# Patient Record
Sex: Female | Born: 1990 | Race: White | Hispanic: No | Marital: Married | Smoking: Heavy tobacco smoker
Health system: Southern US, Community
[De-identification: ages and names within clinical notes are randomized; demographics above are authoritative.]

## PROBLEM LIST (undated history)

## (undated) HISTORY — PX: THYROID SURGERY: SHX805

---

## 2018-10-23 ENCOUNTER — Ambulatory Visit (HOSPITAL_COMMUNITY)
Admission: EM | Admit: 2018-10-23 | Discharge: 2018-10-23 | Disposition: A | Discharge status: Home / Self Care | Payer: Self-pay | Attending: Family Medicine | Admitting: Family Medicine

## 2018-10-23 ENCOUNTER — Encounter (HOSPITAL_COMMUNITY): Payer: Self-pay

## 2018-10-23 DIAGNOSIS — Z113 Encounter for screening for infections with a predominantly sexual mode of transmission: Secondary | ICD-10-CM | POA: Insufficient documentation

## 2018-10-23 DIAGNOSIS — K529 Noninfective gastroenteritis and colitis, unspecified: Secondary | ICD-10-CM | POA: Insufficient documentation

## 2018-10-23 NOTE — ED Notes (Signed)
A labeled specimen in treatment room transferred to lab

## 2018-10-23 NOTE — ED Triage Notes (Signed)
Pt presents to get a note for work after having food poisoning 2 days ago.

## 2018-10-23 NOTE — Discharge Instructions (Addendum)
We have sent testing for sexually transmitted infections. We will notify you of any positive results once they are received. If required, we will prescribe any medications you might need.  Please refrain from all sexual activity for at least the next seven days.  

## 2018-10-24 LAB — CERVICOVAGINAL ANCILLARY ONLY
BACTERIAL VAGINITIS: POSITIVE — AB
Candida vaginitis: NEGATIVE
Chlamydia: NEGATIVE
Neisseria Gonorrhea: NEGATIVE
Trichomonas: NEGATIVE

## 2018-10-24 NOTE — ED Provider Notes (Signed)
[facility]   [id number] 10/23/18 Arrival Time: 1417  ASSESSMENT & PLAN:  1. Noninfectious gastroenteritis, unspecified type   2. Screening for STDs (sexually transmitted diseases)    Gastrointestinal symptoms have resolved. Work note given. Declines empiric STI treatment. Declines HIV/RPR testing.   Discharge Instructions     We have sent testing for sexually transmitted infections. We will notify you of any positive results once they are received. If required, we will prescribe any medications you might need.  Please refrain from all sexual activity for at least the next seven days.     Pending: Labs Reviewed  CERVICOVAGINAL ANCILLARY ONLY    Will notify of any positive results. Instructed to refrain from sexual activity for at least seven days.  Reviewed expectations re: course of current medical issues. Questions answered. Outlined signs and symptoms indicating need for more acute intervention. Patient verbalized understanding. After Visit Summary given.   SUBJECTIVE:  Miranda Wheeler is a 28 y.o. female who requests screening for STI. No current symptoms including vaginal discharge. Urinary symptoms: none. Afebrile. No abdominal or pelvic pain. No n/v. No rashes or lesions. Sexually active with single female partner. OTC treatment: none.  Patient's last menstrual period was 09/14/2018.  Also needs a note for work. Missed a few days secondary to a gastroenteritis.  ROS: As per HPI.  OBJECTIVE:  Vitals:   10/23/18 1437  BP: 123/68  Pulse: 86  Resp: 20  Temp: 98.8 F (37.1 C)  TempSrc: Oral  SpO2: 96%     General appearance: alert, cooperative, appears stated age and no distress Throat: lips, mucosa, and tongue normal; teeth and gums normal CV: RRR Lungs: CTAB Back: no CVA tenderness; FROM at waist Abdomen: soft, non-tender GU: deferred Skin: warm and dry Psychological: alert and cooperative; normal mood and affect.  Pending: Labs  Reviewed  CERVICOVAGINAL ANCILLARY ONLY    No Known Allergies   Family History  Family history unknown: Yes   Social History   Socioeconomic History  . Marital status: Married    Spouse name: Not on file  . Number of children: Not on file  . Years of education: Not on file  . Highest education level: Not on file  Occupational History  . Not on file  Social Needs  . Financial resource strain: Not on file  . Food insecurity:    Worry: Not on file    Inability: Not on file  . Transportation needs:    Medical: Not on file    Non-medical: Not on file  Tobacco Use  . Smoking status: Heavy Tobacco Smoker  . Smokeless tobacco: Never Used  Substance and Sexual Activity  . Alcohol use: Not on file  . Drug use: Not on file  . Sexual activity: Not on file  Lifestyle  . Physical activity:    Days per week: Not on file    Minutes per session: Not on file  . Stress: Not on file  Relationships  . Social connections:    Talks on phone: Not on file    Gets together: Not on file    Attends religious service: Not on file    Active member of club or organization: Not on file    Attends meetings of clubs or organizations: Not on file    Relationship status: Not on file  . Intimate partner violence:    Fear of current or ex partner: Not on file    Emotionally abused: Not on file    Physically  abused: Not on file    Forced sexual activity: Not on file  Other Topics Concern  . Not on file  Social History Narrative  . Not on file          Mardella Layman, MD 11/04/18 [phone]

## 2018-10-25 ENCOUNTER — Telehealth (HOSPITAL_COMMUNITY): Payer: Self-pay | Admitting: Emergency Medicine

## 2018-10-25 MED ORDER — METRONIDAZOLE 500 MG PO TABS: 500.0000 mg | ORAL_TABLET | Freq: Two times a day (BID) | ORAL | 0 refills | Status: DC

## 2018-10-25 NOTE — Telephone Encounter (Signed)
Bacterial vaginosis is positive. This was not treated at the urgent care visit.  Flagyl 500 mg BID x 7 days #14 no refills sent to patients pharmacy of choice.  Pt verbalized understanding 

## 2018-10-26 ENCOUNTER — Other Ambulatory Visit: Payer: Self-pay

## 2018-10-26 DIAGNOSIS — Y9389 Activity, other specified: Secondary | ICD-10-CM | POA: Insufficient documentation

## 2018-10-26 DIAGNOSIS — S01112A Laceration without foreign body of left eyelid and periocular area, initial encounter: Secondary | ICD-10-CM | POA: Insufficient documentation

## 2018-10-26 DIAGNOSIS — Z23 Encounter for immunization: Secondary | ICD-10-CM | POA: Insufficient documentation

## 2018-10-26 DIAGNOSIS — S01511A Laceration without foreign body of lip, initial encounter: Secondary | ICD-10-CM | POA: Insufficient documentation

## 2018-10-26 DIAGNOSIS — S025XXA Fracture of tooth (traumatic), initial encounter for closed fracture: Secondary | ICD-10-CM | POA: Insufficient documentation

## 2018-10-26 DIAGNOSIS — Y92019 Unspecified place in single-family (private) house as the place of occurrence of the external cause: Secondary | ICD-10-CM | POA: Insufficient documentation

## 2018-10-26 DIAGNOSIS — F17200 Nicotine dependence, unspecified, uncomplicated: Secondary | ICD-10-CM | POA: Insufficient documentation

## 2018-10-26 DIAGNOSIS — Y998 Other external cause status: Secondary | ICD-10-CM | POA: Insufficient documentation

## 2018-10-26 DIAGNOSIS — W228XXA Striking against or struck by other objects, initial encounter: Secondary | ICD-10-CM | POA: Insufficient documentation

## 2018-10-26 DIAGNOSIS — S0993XA Unspecified injury of face, initial encounter: Secondary | ICD-10-CM | POA: Insufficient documentation

## 2018-10-27 ENCOUNTER — Emergency Department (HOSPITAL_COMMUNITY): Payer: Medicaid Other

## 2018-10-27 ENCOUNTER — Emergency Department (HOSPITAL_COMMUNITY)
Admission: EM | Admit: 2018-10-27 | Discharge: 2018-10-27 | Disposition: A | Discharge status: Home / Self Care | Payer: Medicaid Other | Attending: Emergency Medicine | Admitting: Emergency Medicine

## 2018-10-27 ENCOUNTER — Other Ambulatory Visit: Payer: Self-pay

## 2018-10-27 ENCOUNTER — Encounter (HOSPITAL_COMMUNITY): Payer: Self-pay | Admitting: Emergency Medicine

## 2018-10-27 DIAGNOSIS — S01112A Laceration without foreign body of left eyelid and periocular area, initial encounter: Secondary | ICD-10-CM

## 2018-10-27 DIAGNOSIS — S01511A Laceration without foreign body of lip, initial encounter: Secondary | ICD-10-CM

## 2018-10-27 DIAGNOSIS — S0993XA Unspecified injury of face, initial encounter: Secondary | ICD-10-CM

## 2018-10-27 DIAGNOSIS — S025XXA Fracture of tooth (traumatic), initial encounter for closed fracture: Secondary | ICD-10-CM

## 2018-10-27 MED ORDER — MORPHINE SULFATE (PF) 4 MG/ML IV SOLN
4.0000 mg | Freq: Once | INTRAVENOUS | Status: AC
  Administered 2018-10-27: 4 mg via INTRAVENOUS
  Filled 2018-10-27: qty 1

## 2018-10-27 MED ORDER — KETOROLAC TROMETHAMINE 60 MG/2ML IM SOLN
30.0000 mg | Freq: Once | INTRAMUSCULAR | Status: AC
  Administered 2018-10-27: 30 mg via INTRAMUSCULAR
  Filled 2018-10-27: qty 2

## 2018-10-27 MED ORDER — LIDOCAINE HCL 2 % IJ SOLN
10.0000 mL | Freq: Once | INTRAMUSCULAR | Status: DC
  Filled 2018-10-27: qty 20

## 2018-10-27 MED ORDER — OXYCODONE-ACETAMINOPHEN 5-325 MG PO TABS: 1.0000 | ORAL_TABLET | Freq: Three times a day (TID) | ORAL | 0 refills | Status: DC | PRN

## 2018-10-27 MED ORDER — LIDOCAINE-EPINEPHRINE (PF) 2 %-1:200000 IJ SOLN
20.0000 mL | Freq: Once | INTRAMUSCULAR | Status: DC
  Filled 2018-10-27: qty 20

## 2018-10-27 MED ORDER — TETANUS-DIPHTH-ACELL PERTUSSIS 5-2.5-18.5 LF-MCG/0.5 IM SUSP
0.5000 mL | Freq: Once | INTRAMUSCULAR | Status: AC
  Administered 2018-10-27: 0.5 mL via INTRAMUSCULAR
  Filled 2018-10-27: qty 0.5

## 2018-10-27 MED ORDER — NICOTINE 21 MG/24HR TD PT24
21.0000 mg | MEDICATED_PATCH | Freq: Once | TRANSDERMAL | Status: DC
  Administered 2018-10-27: 21 mg via TRANSDERMAL
  Filled 2018-10-27: qty 1

## 2018-10-27 MED ORDER — MORPHINE SULFATE (PF) 4 MG/ML IV SOLN
4.0000 mg | Freq: Once | INTRAVENOUS | Status: AC
  Administered 2018-10-27: 4 mg via INTRAMUSCULAR
  Filled 2018-10-27: qty 1

## 2018-10-27 MED ORDER — ONDANSETRON 4 MG PO TBDP
4.0000 mg | ORAL_TABLET | Freq: Once | ORAL | Status: AC
  Administered 2018-10-27: 4 mg via ORAL
  Filled 2018-10-27: qty 1

## 2018-10-27 NOTE — ED Notes (Signed)
ED Provider at bedside. 

## 2018-10-27 NOTE — ED Provider Notes (Signed)
[facility]-EMERGENCY DEPT Provider Note   CSN: [id number] Arrival date & time: 10/26/18  2346    History   Chief Complaint Chief Complaint  Patient presents with  . Facial Laceration    HPI Miranda Wheeler is a 28 y.o. female who presents to the emergency department with a chief complaint of facial injury.  The patient reports that she was attempting to assemble a bunk bed with her significant other when she was hit in the face with 1 of the support rails.  She reports that she completely split her upper lip and is having significant pain in her mouth.  She also has a laceration to her left upper eyebrow.  She denies syncope, headache, nausea, vomiting, visual changes, or epistaxis.  No neck or throat pain.  She reports that she was drinking at least 3 shots of liquor several hours prior to the injury.  She denies any other IV or recreational drug use.     The history is provided by the patient. No language interpreter was used.    History reviewed. No pertinent past medical history.  There are no active problems to display for this patient.   Past Surgical History:  Procedure Laterality Date  . THYROID SURGERY       OB History   No obstetric history on file.      Home Medications    Prior to Admission medications   Medication Sig Start Date End Date Taking? Authorizing Provider  metroNIDAZOLE (FLAGYL) 500 MG tablet Take 1 tablet (500 mg total) by mouth 2 (two) times daily. 10/25/18  Yes Eustace Moore, MD  oxyCODONE-acetaminophen (PERCOCET/ROXICET) 5-325 MG tablet Take 1 tablet by mouth every 8 (eight) hours as needed for severe pain. 10/27/18   Amandeep Nesmith, Coral Else, PA-C    Family History Family History  Family history unknown: Yes    Social History Social History   Tobacco Use  . Smoking status: Heavy Tobacco Smoker  . Smokeless tobacco: Never Used  Substance Use Topics  . Alcohol use: Yes  . Drug use: Not Currently     Allergies     Patient has no known allergies.   Review of Systems Review of Systems  Constitutional: Negative for activity change, chills and fever.  HENT: Positive for facial swelling.   Respiratory: Negative for shortness of breath.   Cardiovascular: Negative for chest pain.  Gastrointestinal: Negative for abdominal pain.  Genitourinary: Negative for dysuria.  Musculoskeletal: Positive for arthralgias and myalgias. Negative for back pain.  Skin: Positive for wound. Negative for rash.  Allergic/Immunologic: Negative for immunocompromised state.  Neurological: Negative for syncope, weakness, numbness and headaches.  Psychiatric/Behavioral: Negative for confusion.     Physical Exam Updated Vital Signs BP 112/71 (BP Location: Left Arm)   Pulse 66   Resp 20   Ht 5\' 1"  (1.549 m)   Wt 59 kg   SpO2 99%   BMI 24.56 kg/m   Physical Exam Vitals signs and nursing note reviewed.  Constitutional:      General: She is not in acute distress.    Comments: Appears intoxicated.  Uncooperative. Crying.   HENT:     Head: Normocephalic. No raccoon eyes or Battle's sign.     Jaw: There is normal jaw occlusion. No trismus or swelling.     Comments: Jagged through and through laceration to the upper middle lip that does not cross the vermilion border.  Tooth #8 is fractured along the buccal surface and appears somewhat  recessed.  Exam is somewhat limited as patient does not tolerate much movement of the superior lip.  There is a 2 cm deep laceration to the left eyebrow.  The patient is able to raise her eyebrows and the frontalis muscle appears intact.  Wound is clean and hemostatic.  There is a small superficial abrasion just inferior to the nose and superior to the upper lip that appears old.    Right Ear: Hearing, tympanic membrane and ear canal normal.     Left Ear: Hearing, tympanic membrane and ear canal normal.     Mouth/Throat:     Pharynx: Uvula midline. No pharyngeal swelling, oropharyngeal  exudate, posterior oropharyngeal erythema or uvula swelling.     Comments: No tongue laceration Eyes:     Conjunctiva/sclera: Conjunctivae normal.  Neck:     Musculoskeletal: Neck supple.  Cardiovascular:     Rate and Rhythm: Normal rate and regular rhythm.     Heart sounds: No murmur. No friction rub. No gallop.   Pulmonary:     Effort: Pulmonary effort is normal. No respiratory distress.  Abdominal:     General: There is no distension.     Palpations: Abdomen is soft. There is no mass.     Tenderness: There is no abdominal tenderness. There is no right CVA tenderness, left CVA tenderness, guarding or rebound.     Hernia: No hernia is present.  Skin:    General: Skin is warm.     Findings: No rash.  Neurological:     Mental Status: She is alert.  Psychiatric:        Behavior: Behavior normal.                  ED Treatments / Results  Labs (all labs ordered are listed, but only abnormal results are displayed) Labs Reviewed - No data to display  EKG None  Radiology Ct Maxillofacial Wo Contrast  Result Date: 10/27/2018 CLINICAL DATA:  Recent fall with lip laceration and forehead laceration, initial encounter EXAM: CT MAXILLOFACIAL WITHOUT CONTRAST TECHNIQUE: Multidetector CT imaging of the maxillofacial structures was performed. Multiplanar CT image reconstructions were also generated. COMPARISON:  None. FINDINGS: Osseous: No acute fracture is identified. A defect is noted within the right maxillary central incisor on the right laterally likely related to a acutely chipped tooth. No other acute dental abnormality is noted. Orbits: The orbits and their contents are within normal limits. Sinuses: Paranasal sinuses are unremarkable. Soft tissues: Laceration is noted through the midportion of the upper lip. No other focal soft tissue abnormality is noted. Limited intracranial: Within normal limits. IMPRESSION: Upper lip laceration in the midline consistent with the given  clinical history. There is a defect noted in the lateral aspect of the right maxillary central incisor. This may be related to a chipped tooth. Electronically Signed   By: Alcide Clever M.D.   On: 10/27/2018 07:10    Procedures .Marland KitchenLaceration Repair Date/Time: 10/27/2018 10:42 AM Performed by: Barkley Boards, PA-C Authorized by: Barkley Boards, PA-C   Consent:    Consent obtained:  Verbal   Consent given by:  Patient   Risks discussed:  Infection, pain, retained foreign body, tendon damage, poor cosmetic result, need for additional repair, nerve damage, poor wound healing and vascular damage   Alternatives discussed:  No treatment Anesthesia (see MAR for exact dosages):    Anesthesia method:  Local infiltration   Local anesthetic:  Lidocaine 2% WITH epi Laceration details:    Location:  Face   Face location:  L eyebrow   Length (cm):  2   Depth (mm):  3 Repair type:    Repair type:  Complex Pre-procedure details:    Preparation:  Patient was prepped and draped in usual sterile fashion and imaging obtained to evaluate for foreign bodies Exploration:    Hemostasis achieved with:  Direct pressure   Wound exploration: wound explored through full range of motion     Wound extent: no fascia violation noted, no foreign bodies/material noted, no muscle damage noted, no nerve damage noted, no tendon damage noted, no underlying fracture noted and no vascular damage noted     Wound extent comment:  Subcutaneous tissue violated   Contaminated: no   Treatment:    Area cleansed with:  Shur-Clens   Amount of cleaning:  Extensive   Visualized foreign bodies/material removed: no     Debridement:  None Subcutaneous repair:    Suture size:  4-0   Suture material:  Vicryl   Subcutaneous suture technique: buried sutures.   Number of sutures:  3 Skin repair:    Repair method:  Sutures   Suture size:  4-0   Suture material:  Prolene   Number of sutures:  4 Approximation:    Approximation:   Close Post-procedure details:    Dressing:  Open (no dressing)   Patient tolerance of procedure:  Tolerated well, no immediate complications .Marland KitchenLaceration Repair Date/Time: 10/27/2018 10:46 AM Performed by: Barkley Boards, PA-C Authorized by: Barkley Boards, PA-C   Consent:    Consent obtained:  Verbal   Consent given by:  Patient   Risks discussed:  Infection, pain, retained foreign body, vascular damage, poor wound healing, nerve damage and need for additional repair   Alternatives discussed:  No treatment Anesthesia (see MAR for exact dosages):    Anesthesia method:  Local infiltration   Local anesthetic:  Lidocaine 2% w/o epi Laceration details:    Location:  Lip   Lip location:  Upper lip, full thickness   Height of lip laceration:  More than half vertical height   Length (cm):  2 Repair type:    Repair type:  Complex Pre-procedure details:    Preparation:  Patient was prepped and draped in usual sterile fashion and imaging obtained to evaluate for foreign bodies Exploration:    Hemostasis achieved with:  Direct pressure   Wound exploration: wound explored through full range of motion and entire depth of wound probed and visualized     Wound extent: muscle damage     Wound extent: no foreign bodies/material noted, no nerve damage noted, no tendon damage noted, no underlying fracture noted and no vascular damage noted   Treatment:    Area cleansed with:  Saline   Amount of cleaning:  Standard Skin repair:    Repair method:  Sutures   Suture size:  4-0   Wound skin closure material used: Vicryl.   Suture technique:  Simple interrupted   Number of sutures:  5 Approximation:    Approximation:  Close   Vermilion border: well-aligned   Post-procedure details:    Dressing:  Open (no dressing)   Patient tolerance of procedure:  Tolerated well, no immediate complications   (including critical care time)  Medications Ordered in ED Medications  lidocaine-EPINEPHrine  (XYLOCAINE W/EPI) 2 %-1:200000 (PF) injection 20 mL (0 mLs Infiltration Hold 10/27/18 0507)  lidocaine (XYLOCAINE) 2 % (with pres) injection 200 mg (0 mg Infiltration Hold 10/27/18 0507)  nicotine (  NICODERM CQ - dosed in mg/24 hours) patch 21 mg (21 mg Transdermal Patch Applied 10/27/18 0507)  morphine 4 MG/ML injection 4 mg (4 mg Intravenous Given 10/27/18 0506)  morphine 4 MG/ML injection 4 mg (4 mg Intramuscular Given 10/27/18 0734)  ondansetron (ZOFRAN-ODT) disintegrating tablet 4 mg (4 mg Oral Given 10/27/18 0931)  ketorolac (TORADOL) injection 30 mg (30 mg Intramuscular Given 10/27/18 0934)  Tdap (BOOSTRIX) injection 0.5 mL (0.5 mLs Intramuscular Given 10/27/18 0931)     Initial Impression / Assessment and Plan / ED Course  I have reviewed the triage vital signs and the nursing notes.  Pertinent labs & imaging results that were available during my care of the patient were reviewed by me and considered in my medical decision making (see chart for details).        28 year old female presenting with a facial injury after she was hit with a metal rod while attempting to assemble bunk beds.  She has a deep laceration to her left eyebrow and a through and through laceration to the upper lip that does not cross the vermilion border.  She also appears to have fractured tooth #8 6 on the buccal aspect.  CT maxillofacial is negative for fracture.  Initially, the patient was uncooperative and was difficult to assess.  Morphine was given for pain control.  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  She denies substance use other than EtOH tonight however, the patient does appear intoxicated on substances aside from just alcohol.  As she was observed for several hours in the ER, she appeared to metabolize the substance.  Through and through lip laceration was repaired with Vicryl sutures.  The orbicularis oris muscle was also repaired.  The superior portion of  the wound was jagged in a Y-shaped laceration, which was also closed.  Deep eyebrow laceration was initially closed with 3 buried sutures and 4 epidermal simple interrupted sutures.  She tolerated the procedure well.  She was given information on scar minimization.  She will be given a referral to plastic surgery and to dentistry.  Tdap updated.  Given multiple injuries, we will also discharge with a short course of Percocet. A 36-month prescription history query was performed using the [place] CSRS prior to discharge.  I have also strongly advised the patient to discontinue any other sedating substances while she is using this medication.  She acknowledges this conversation and all questions were answered.  She has no other medical conditions to impair wound healing and prophylactic antibiotics are not indicated at this time.  Strict return precautions given.  She is hemodynamically stable in no acute distress.  She is safe for discharge home with outpatient follow-up at this time.  Final Clinical Impressions(s) / ED Diagnoses   Final diagnoses:  Facial injury, initial encounter  Closed fracture of tooth, initial encounter  Complicated laceration of lip, initial encounter  Laceration of left eyebrow with complication, initial encounter    ED Discharge Orders         Ordered    oxyCODONE-acetaminophen (PERCOCET/ROXICET) 5-325 MG tablet  Every 8 hours PRN     10/27/18 0908           Frederik Pear A, PA-C 10/27/18 1059    Nira Conn, MD 10/27/18 1829

## 2018-10-27 NOTE — Discharge Instructions (Addendum)
Thank you for allowing me to care for you today in the Emergency Department.   Your Tdap was updated today.  Your imaging did not show any broken bones.  However, part of your tooth is broken.  You need to follow-up with a dentist.  I have provided you with a list of dentists in the area.  Sometimes you need to call everyone on the entire list before you can find someone that we see you.  To care for your wounds at home, keep the area clean and dry with warm water and soap.  You can apply a topical antibiotic such as bacitracin or Neosporin to the wound on your eyebrow to prevent infection.  Swish spit warm salt water 2 times daily to help prevent infection in the mouth.  Wounds in the mouth heal quickly and the sutures should dissolve on their own.  The sutures on her eyebrow will not dissolve and need to be removed in approximately 5 to 7 days.  You can have them removed by returning to the emergency department, going to urgent care, or following up with primary care if you establish with family medicine.  For pain control, take 650 mg of Tylenol or 600 mg of ibuprofen with food once every 6 hours or alternate between these 2 medications every 3 hours.  For severe, uncontrollable pain, you can take 1 tablet of Percocet.  You should not drink alcohol or use any other recreational or drugs that may make you drowsy. You can also apply an ice and ice pack to your lip or to your eyebrow to help with swelling for 15 to 20 minutes as frequently as needed.  You should return to the emergency department if you develop fever, chills, if you start to have thick, mucus-like drainage from the wound, severe redness, or worsening swelling.

## 2018-10-27 NOTE — ED Notes (Signed)
Patient attempted to leave. EDPA Mia notified. EDPA escorted patient back to room and is at bedside.

## 2018-10-27 NOTE — ED Triage Notes (Addendum)
Patient was putting medal bed together and the bed rail fell. The rail hit her in her forehead. Patient has a laceration. Patient lip also has a laceration.

## 2018-10-27 NOTE — ED Notes (Signed)
Patient transported to CT 

## 2018-12-24 ENCOUNTER — Encounter (HOSPITAL_COMMUNITY): Payer: Self-pay | Admitting: Emergency Medicine

## 2018-12-24 ENCOUNTER — Other Ambulatory Visit: Payer: Self-pay

## 2018-12-24 ENCOUNTER — Emergency Department (HOSPITAL_COMMUNITY)
Admission: EM | Admit: 2018-12-24 | Discharge: 2018-12-24 | Disposition: A | Discharge status: Home / Self Care | Payer: Medicaid Other | Attending: Emergency Medicine | Admitting: Emergency Medicine

## 2018-12-24 DIAGNOSIS — H60502 Unspecified acute noninfective otitis externa, left ear: Secondary | ICD-10-CM

## 2018-12-24 DIAGNOSIS — H6092 Unspecified otitis externa, left ear: Secondary | ICD-10-CM | POA: Diagnosis not present

## 2018-12-24 DIAGNOSIS — F17200 Nicotine dependence, unspecified, uncomplicated: Secondary | ICD-10-CM | POA: Diagnosis not present

## 2018-12-24 DIAGNOSIS — H9202 Otalgia, left ear: Secondary | ICD-10-CM | POA: Diagnosis present

## 2018-12-24 MED ORDER — NEOMYCIN-POLYMYXIN-HC 3.5-10000-1 OT SUSP: 4.0000 [drp] | Freq: Four times a day (QID) | OTIC | 0 refills | Status: DC

## 2018-12-24 MED ORDER — OXYCODONE-ACETAMINOPHEN 5-325 MG PO TABS
1.0000 | ORAL_TABLET | Freq: Once | ORAL | Status: AC
  Administered 2018-12-24: 04:00:00 1 via ORAL
  Filled 2018-12-24: qty 1

## 2018-12-24 MED ORDER — OXYCODONE-ACETAMINOPHEN 5-325 MG PO TABS: 1.0000 | ORAL_TABLET | ORAL | 0 refills | Status: DC | PRN

## 2018-12-24 NOTE — Discharge Instructions (Signed)
Apply warm compresses as needed.  Take acetaminophen and ibuprofen as needed for less severe pain.

## 2018-12-24 NOTE — ED Notes (Signed)
Patient verbalized understanding of dc instructions, vss, ambulatory with nad.   

## 2018-12-24 NOTE — ED Provider Notes (Signed)
[facility] Provider Note   CSN: [id number] Arrival date & time: 12/24/18  [id number]    History   Chief Complaint Chief Complaint  Patient presents with  . Otalgia    HPI Miranda Wheeler is a 28 y.o. female.   The history is provided by the patient.  Otalgia  She woke up complaining of severe pain in her left ear.  There is no difficulty hearing.  She denies any trauma.  He denies fever or chills.  Denies rhinorrhea or sore throat or cough.  She rates pain a 10/10.  She has not done anything to treat this.  History reviewed. No pertinent past medical history.  There are no active problems to display for this patient.   Past Surgical History:  Procedure Laterality Date  . THYROID SURGERY       OB History   No obstetric history on file.      Home Medications    Prior to Admission medications   Medication Sig Start Date End Date Taking? Authorizing Provider  neomycin-polymyxin-hydrocortisone (CORTISPORIN) 3.5-10000-1 OTIC suspension Place 4 drops into the left ear 4 (four) times daily. X 7 days 12/24/18   Dione BoozeGlick, Maebel Marasco, MD  oxyCODONE-acetaminophen (PERCOCET/ROXICET) 5-325 MG tablet Take 1 tablet by mouth every 4 (four) hours as needed for severe pain. 12/24/18   Dione BoozeGlick, Jhada Risk, MD    Family History Family History  Family history unknown: Yes    Social History Social History   Tobacco Use  . Smoking status: Heavy Tobacco Smoker  . Smokeless tobacco: Never Used  Substance Use Topics  . Alcohol use: Yes  . Drug use: Not Currently     Allergies   Patient has no known allergies.   Review of Systems Review of Systems  HENT: Positive for ear pain.   All other systems reviewed and are negative.    Physical Exam Updated Vital Signs BP (!) 118/95   Pulse 96   SpO2 99%   Physical Exam Vitals signs and nursing note reviewed.    28 year old female, resting comfortably and in no acute distress. Vital signs are significant  for elevated diastolic blood pressure. Oxygen saturation is 99%, which is normal. Head is normocephalic and atraumatic. PERRLA, EOMI. Oropharynx is clear.  Right tympanic membrane is clear.  Left tympanic membrane was difficult to visualize because of pain elicited with inserting speculum into the patient's ear, but the portions of the tympanic membrane that were seen appeared normal.  There is marked tenderness the mucosa of the external auditory canal on the left but no obvious drainage.  Pain is not elicited when tension is applied to the helix. Neck is nontender and supple without adenopathy or JVD. Back is nontender and there is no CVA tenderness. Lungs are clear without rales, wheezes, or rhonchi. Chest is nontender. Heart has regular rate and rhythm without murmur. Abdomen is soft, flat, nontender without masses or hepatosplenomegaly and peristalsis is normoactive. Extremities have no cyanosis or edema, full range of motion is present. Skin is warm and dry without rash. Neurologic: Mental status is normal, cranial nerves are intact, there are no motor or sensory deficits.  ED Treatments / Results   Procedures Procedures   Medications Ordered in ED Medications  oxyCODONE-acetaminophen (PERCOCET/ROXICET) 5-325 MG per tablet 1 tablet (has no administration in time range)     Initial Impression / Assessment and Plan / ED Course  I have reviewed the triage vital signs and the nursing notes.  Left otitis externa.  Her record on the [place] controlled substance reporting website was reviewed, and she has 1 prescription for 8 Percocet 2 months ago, no other prescriptions in the last 5 years.  She is given a prescription for oxycodone-acetaminophen and Cortisporin otic suspension.  Referred to ENT for follow-up.  Final Clinical Impressions(s) / ED Diagnoses   Final diagnoses:  Acute otitis externa of left ear, unspecified type    ED Discharge Orders         Ordered     oxyCODONE-acetaminophen (PERCOCET/ROXICET) 5-325 MG tablet  Every 4 hours PRN,   Status:  Discontinued     12/24/18 0342    neomycin-polymyxin-hydrocortisone (CORTISPORIN) 3.5-10000-1 OTIC suspension  4 times daily,   Status:  Discontinued     12/24/18 0342    neomycin-polymyxin-hydrocortisone (CORTISPORIN) 3.5-10000-1 OTIC suspension  4 times daily     12/24/18 0343    oxyCODONE-acetaminophen (PERCOCET/ROXICET) 5-325 MG tablet  Every 4 hours PRN     12/24/18 0343           Dione Booze, MD 12/24/18 [id number]

## 2018-12-24 NOTE — ED Triage Notes (Signed)
Patient c/o L ear pain that awoke her from sleep tonight, denies any other symptoms. Patient tearful in triage.

## 2019-02-27 LAB — OB RESULTS CONSOLE GC/CHLAMYDIA
Chlamydia: NEGATIVE
Gonorrhea: NEGATIVE

## 2019-02-27 LAB — OB RESULTS CONSOLE ABO/RH: RH Type: NEGATIVE

## 2019-02-27 LAB — OB RESULTS CONSOLE ANTIBODY SCREEN: Antibody Screen: NEGATIVE

## 2019-02-27 LAB — OB RESULTS CONSOLE RUBELLA ANTIBODY, IGM: Rubella: IMMUNE

## 2019-02-27 LAB — OB RESULTS CONSOLE HIV ANTIBODY (ROUTINE TESTING): HIV: NONREACTIVE

## 2019-02-27 LAB — OB RESULTS CONSOLE HEPATITIS B SURFACE ANTIGEN: Hepatitis B Surface Ag: NEGATIVE

## 2019-02-27 LAB — OB RESULTS CONSOLE RPR: RPR: NONREACTIVE

## 2019-08-12 LAB — OB RESULTS CONSOLE GBS: GBS: NEGATIVE

## 2019-08-22 NOTE — L&D Delivery Note (Signed)
Delivery Note   Patient Name: Miranda Wheeler DOB: 1991-07-24 MRN: [id number]  Date of admission: 09/13/2019 Delivering MD: Dale [place]  Date of delivery: 09/14/19 Type of delivery: SVD  Newborn Data: Live born female  Birth Weight:   APGAR: 2, 8  Newborn Delivery   Birth date/time: 09/13/2019 23:28:00 Delivery type: Vaginal, Spontaneous     Miranda Wheeler, 28 y.o., @ [redacted]w[redacted]d,  G2P1, who was admitted for spontaneous labor. I was called to the room when she progressed +2 station in the second stage of labor.  She pushed for 30/min.  She delivered a viable infant, cephalic and restituted to the LOA position over an intact perineum.  A nuchal cord   was identified and unable to reduce newborn somersaulted through. The baby was placed on maternal abdomen while initial step of NRP were perfmored (Dry, Stimulated, and warmed). Hat placed on baby for thermoregulation. Delayed cord clamping was not performed.  Cord double clamped and cut.  Cord cut by Myself and newborn handed off to NICU for evaluation. Apgar scores were 2 and 8. Prophylactic Pitocin was started in the third stage of labor for active management. The placenta delivered spontaneously, shultz, with a 3 vessel cord and was sent to LD.  Inspection revealed bilateral labial hemostatic and small no repairs.. An examination of the vaginal vault and cervix was free from lacerations. The uterus was firm, bleeding stable.  Placenta and umbilical artery blood gas were sent.  There then was notice to have a gush of blood, uterus firm but lower uterine segments was boggy, TXA was given and retained placenta was noted and manual extraction performed unasyn 3gm x1 times dose was given, placenta to path for substance use during pregnancy, and retained placenta. Uterus then became firm and bleeding controlled. There were no complications during the procedure.  Mom and baby skin to skin following delivery. Left in stable condition.  Maternal  Info: Anesthesia: Epidural Episiotomy: no Lacerations:  labial superficial tears Suture Repair: no Est. Blood Loss (mL):   Newborn Info:  Baby Sex: female Circumcision: out pt desired Babies Name: AMbitious APGAR (1 MIN): 2   APGAR (5 MINS): 8   APGAR (10 MINS):    Contains abnormal data Cord Blood Gas (Venous) Order: [id number] Status:  Final result  Visible to patient:  No (scheduled for 09/14/2019 12:52 AM)  Next appt:  None  Ref Range & Units 1 d ago  Ph Cord Blood (Venous) 7.240 - 7.380 7.391High    pCO2 Cord Blood (Venous) 42.0 - 56.0 39.6Low    Bicarbonate 13.0 - 22.0 mmol/L 23.5High         Contains abnormal data Cord Blood Gas (Arterial) Order: [id number] Status:  Final result  Visible to patient:  No (scheduled for 09/14/2019 12:51 AM)  Next appt:  None   (important suggestion)  Newer results are available. Click to view them now.   Ref Range & Units 1 d ago  pH cord blood (arterial) 7.210 - 7.380 7.392High    pCO2 cord blood (arterial) 42.0 - 56.0 mmHg 40.4Low    Bicarbonate 13.0 - 22.0 mmol/L 24.0High            Mom to postpartum.  Baby to Couplet care / Skin to Skin.   [place], [place], NP-C 09/14/19 12:07 AM

## 2019-09-10 ENCOUNTER — Telehealth (HOSPITAL_COMMUNITY): Payer: Self-pay | Admitting: *Deleted

## 2019-09-10 ENCOUNTER — Encounter (HOSPITAL_COMMUNITY): Payer: Self-pay | Admitting: *Deleted

## 2019-09-10 NOTE — Telephone Encounter (Signed)
Preadmission screen  

## 2019-09-11 ENCOUNTER — Other Ambulatory Visit: Payer: Self-pay | Admitting: Obstetrics and Gynecology

## 2019-09-13 ENCOUNTER — Inpatient Hospital Stay (HOSPITAL_COMMUNITY): Payer: Medicaid Other | Admitting: Anesthesiology

## 2019-09-13 ENCOUNTER — Inpatient Hospital Stay (HOSPITAL_COMMUNITY)
Admission: EM | Admit: 2019-09-13 | Discharge: 2019-09-15 | DRG: 806 | Disposition: A | Discharge status: Home / Self Care | Payer: Medicaid Other | Attending: Obstetrics & Gynecology | Admitting: Obstetrics & Gynecology

## 2019-09-13 ENCOUNTER — Other Ambulatory Visit: Payer: Self-pay

## 2019-09-13 ENCOUNTER — Encounter (HOSPITAL_COMMUNITY): Payer: Self-pay | Admitting: Obstetrics and Gynecology

## 2019-09-13 ENCOUNTER — Other Ambulatory Visit (HOSPITAL_COMMUNITY): Payer: Medicaid Other

## 2019-09-13 DIAGNOSIS — F17210 Nicotine dependence, cigarettes, uncomplicated: Secondary | ICD-10-CM | POA: Diagnosis present

## 2019-09-13 DIAGNOSIS — F1910 Other psychoactive substance abuse, uncomplicated: Secondary | ICD-10-CM | POA: Diagnosis present

## 2019-09-13 DIAGNOSIS — O26893 Other specified pregnancy related conditions, third trimester: Secondary | ICD-10-CM | POA: Diagnosis present

## 2019-09-13 DIAGNOSIS — Z6791 Unspecified blood type, Rh negative: Secondary | ICD-10-CM

## 2019-09-13 DIAGNOSIS — O99324 Drug use complicating childbirth: Secondary | ICD-10-CM | POA: Diagnosis present

## 2019-09-13 DIAGNOSIS — O99334 Smoking (tobacco) complicating childbirth: Secondary | ICD-10-CM | POA: Diagnosis present

## 2019-09-13 DIAGNOSIS — Z20822 Contact with and (suspected) exposure to covid-19: Secondary | ICD-10-CM | POA: Diagnosis present

## 2019-09-13 DIAGNOSIS — F1290 Cannabis use, unspecified, uncomplicated: Secondary | ICD-10-CM | POA: Diagnosis present

## 2019-09-13 DIAGNOSIS — Z3A40 40 weeks gestation of pregnancy: Secondary | ICD-10-CM

## 2019-09-13 DIAGNOSIS — O99344 Other mental disorders complicating childbirth: Secondary | ICD-10-CM | POA: Diagnosis present

## 2019-09-13 DIAGNOSIS — F3181 Bipolar II disorder: Secondary | ICD-10-CM | POA: Diagnosis present

## 2019-09-13 LAB — RAPID URINE DRUG SCREEN, HOSP PERFORMED
Amphetamines: NOT DETECTED
Barbiturates: NOT DETECTED
Benzodiazepines: NOT DETECTED
Cocaine: NOT DETECTED
Opiates: NOT DETECTED
Tetrahydrocannabinol: POSITIVE — AB

## 2019-09-13 LAB — CBC
HCT: 37.4 % (ref 36.0–46.0)
Hemoglobin: 11.7 g/dL — ABNORMAL LOW (ref 12.0–15.0)
MCH: 30.6 pg (ref 26.0–34.0)
MCHC: 31.3 g/dL (ref 30.0–36.0)
MCV: 97.9 fL (ref 80.0–100.0)
Platelets: 261 10*3/uL (ref 150–400)
RBC: 3.82 MIL/uL — ABNORMAL LOW (ref 3.87–5.11)
RDW: 13.6 % (ref 11.5–15.5)
WBC: 15.4 10*3/uL — ABNORMAL HIGH (ref 4.0–10.5)
nRBC: 0 % (ref 0.0–0.2)

## 2019-09-13 LAB — SARS CORONAVIRUS 2 (TAT 6-24 HRS): SARS Coronavirus 2: NEGATIVE

## 2019-09-13 MED ORDER — EPHEDRINE 5 MG/ML INJ: 10.0000 mg | INTRAVENOUS | Status: DC | PRN

## 2019-09-13 MED ORDER — LACTATED RINGERS IV SOLN: 500.0000 mL | INTRAVENOUS | Status: DC | PRN

## 2019-09-13 MED ORDER — FENTANYL CITRATE (PF) 100 MCG/2ML IJ SOLN
50.0000 ug | Freq: Once | INTRAMUSCULAR | Status: AC
  Administered 2019-09-13: 50 ug via INTRAVENOUS
  Filled 2019-09-13: qty 2

## 2019-09-13 MED ORDER — TRANEXAMIC ACID-NACL 1000-0.7 MG/100ML-% IV SOLN: 1000.0000 mg | INTRAVENOUS | Status: DC

## 2019-09-13 MED ORDER — OXYTOCIN 40 UNITS IN NORMAL SALINE INFUSION - SIMPLE MED
2.5000 [IU]/h | INTRAVENOUS | Status: DC
  Administered 2019-09-14: 2.5 [IU]/h via INTRAVENOUS
  Filled 2019-09-13: qty 1000

## 2019-09-13 MED ORDER — SODIUM CHLORIDE 0.9 % IV SOLN
3.0000 g | Freq: Once | INTRAVENOUS | Status: AC
  Administered 2019-09-14: 3 g via INTRAVENOUS
  Filled 2019-09-13: qty 8

## 2019-09-13 MED ORDER — SOD CITRATE-CITRIC ACID 500-334 MG/5ML PO SOLN: 30.0000 mL | ORAL | Status: DC | PRN

## 2019-09-13 MED ORDER — LACTATED RINGERS IV SOLN: INTRAVENOUS | Status: DC

## 2019-09-13 MED ORDER — PHENYLEPHRINE 40 MCG/ML (10ML) SYRINGE FOR IV PUSH (FOR BLOOD PRESSURE SUPPORT): 80.0000 ug | PREFILLED_SYRINGE | INTRAVENOUS | Status: DC | PRN

## 2019-09-13 MED ORDER — LACTATED RINGERS IV SOLN
500.0000 mL | Freq: Once | INTRAVENOUS | Status: AC
  Administered 2019-09-13: 500 mL via INTRAVENOUS

## 2019-09-13 MED ORDER — DIPHENHYDRAMINE HCL 50 MG/ML IJ SOLN
25.0000 mg | Freq: Once | INTRAMUSCULAR | Status: AC
  Administered 2019-09-13: 25 mg via INTRAVENOUS

## 2019-09-13 MED ORDER — ACETAMINOPHEN 325 MG PO TABS: 650.0000 mg | ORAL_TABLET | ORAL | Status: DC | PRN

## 2019-09-13 MED ORDER — LIDOCAINE HCL (PF) 1 % IJ SOLN
INTRAMUSCULAR | Status: DC | PRN
  Administered 2019-09-13: 5 mL via EPIDURAL

## 2019-09-13 MED ORDER — SODIUM CHLORIDE (PF) 0.9 % IJ SOLN
INTRAMUSCULAR | Status: DC | PRN
  Administered 2019-09-13: 12 mL/h via EPIDURAL

## 2019-09-13 MED ORDER — DIPHENHYDRAMINE HCL 50 MG/ML IJ SOLN
12.5000 mg | INTRAMUSCULAR | Status: DC | PRN
  Filled 2019-09-13: qty 1

## 2019-09-13 MED ORDER — OXYTOCIN 40 UNITS IN NORMAL SALINE INFUSION - SIMPLE MED: 1.0000 m[IU]/min | INTRAVENOUS | Status: DC

## 2019-09-13 MED ORDER — OXYCODONE-ACETAMINOPHEN 5-325 MG PO TABS: 1.0000 | ORAL_TABLET | ORAL | Status: DC | PRN

## 2019-09-13 MED ORDER — TERBUTALINE SULFATE 1 MG/ML IJ SOLN: 0.2500 mg | Freq: Once | INTRAMUSCULAR | Status: DC | PRN

## 2019-09-13 MED ORDER — FENTANYL-BUPIVACAINE-NACL 0.5-0.125-0.9 MG/250ML-% EP SOLN
12.0000 mL/h | EPIDURAL | Status: DC | PRN
  Filled 2019-09-13: qty 250

## 2019-09-13 MED ORDER — MISOPROSTOL 25 MCG QUARTER TABLET: 25.0000 ug | ORAL_TABLET | ORAL | Status: DC | PRN

## 2019-09-13 MED ORDER — OXYCODONE-ACETAMINOPHEN 5-325 MG PO TABS: 2.0000 | ORAL_TABLET | ORAL | Status: DC | PRN

## 2019-09-13 MED ORDER — LIDOCAINE HCL (PF) 1 % IJ SOLN: 30.0000 mL | INTRAMUSCULAR | Status: DC | PRN

## 2019-09-13 MED ORDER — ONDANSETRON HCL 4 MG/2ML IJ SOLN: 4.0000 mg | Freq: Four times a day (QID) | INTRAMUSCULAR | Status: DC | PRN

## 2019-09-13 MED ORDER — TRANEXAMIC ACID-NACL 1000-0.7 MG/100ML-% IV SOLN
INTRAVENOUS | Status: AC
  Administered 2019-09-13: 1000 mg
  Filled 2019-09-13: qty 100

## 2019-09-13 MED ORDER — OXYTOCIN BOLUS FROM INFUSION
500.0000 mL | Freq: Once | INTRAVENOUS | Status: AC
  Administered 2019-09-13: 500 mL via INTRAVENOUS

## 2019-09-13 NOTE — Progress Notes (Signed)
Labor Progress Note  Miranda Wheeler is a 29 y.o. female, G2P1001, IUP at 40.4 weeks, presenting for spontaneous early labor with SROM. RhD negative rhogam given second trimester. EFW 4.12lb via [place] on 12/7. Smoker. EIF left and right ventricular foci noted on [place]. Pt has h/o mood disorder and substance abuse, lack of social support, followed by [facility]   Pt endorse + Fm. Denies vaginal bleeding.   Subjective: Pt Stabel just received epidural, pt admitted to smoking [place] today and has been since second to third trimester, for nausea, pt endorses having bipolar mood disorder, no meds, mood stable, denies SI, HI, does not follow up with any tx anymore, pt denies to ever using any medication for mood disorder just dealing with it on her own  Patient Active Problem List   Diagnosis Date Noted  . Normal labor and delivery 09/13/2019   Objective: BP (!) 129/41   Pulse 76   Temp 98.2 F (36.8 C)   Resp 18   Ht 5\' 4"  (1.626 m)   Wt 74.4 kg   LMP 09/14/2018   SpO2 100%   BMI 28.15 kg/m  No intake/output data recorded. No intake/output data recorded. NST: FHR baseline 120 bpm, Variability: moderate, Accelerations:present, Decelerations:  Absent= Cat 1/Reactive CTX:  irregular, every 3-5.5 minutes, lasting 60-100 seconds Uterus gravid, soft non tender, moderate to palpate with contractions.  SVE:  Dilation: 4 Effacement (%): 80 Station: -2 Exam by:: [email or web address], CNM Pitocin at (Has not started yet) mUn/min  Assessment:  Miranda Wheeler is a 29 y.o. female, G2P1001, IUP at 40.4 weeks, presenting for spontaneous early labor with SROM. RhD negative rhogam given second trimester. EFW 4.12lb via 26 on 12/7. Smoker. EIF left and right ventricular foci noted on 14/7. Pt has h/o mood disorder and substance abuse, lack of social support, pt in latent labor no cervical change over last hour, pt getting comfortable with epidural  Patient Active Problem List   Diagnosis Date Noted  . Normal  labor and delivery 09/13/2019   NICHD: Category 1  Membranes:  SROM 1/23 @ 1700x 2hrs, no s/s of infection  Induction:    Pitocin - Not started   Pain management:    IV pain meds: x1 dose fentanyl @ 1650             Epidural placement:  at 1/23 on 1800  GBS negative  Plan: Continue labor plan Continuous monitoring Rest/peanut ball Frequent position changes to facilitate fetal rotation and descent. Will reassess with cervical exam if necessary Anticipate pitocin per protocol 2x2 if cxt do not pick up in four hours  Anticipate labor progression and vaginal delivery.   2/23, NP-C, CNM, MSN 09/13/2019. 6:06 PM

## 2019-09-13 NOTE — Anesthesia Procedure Notes (Signed)
Epidural Patient location during procedure: OB Start time: 09/13/2019 5:18 PM End time: 09/13/2019 5:31 PM  Staffing Anesthesiologist: Trevor Iha, MD Performed: anesthesiologist   Preanesthetic Checklist Completed: patient identified, IV checked, site marked, risks and benefits discussed, surgical consent, monitors and equipment checked, pre-op evaluation and timeout performed  Epidural Patient position: sitting Prep: DuraPrep and site prepped and draped Patient monitoring: continuous pulse ox and blood pressure Approach: midline Location: L3-L4 Injection technique: LOR air  Needle:  Needle type: Tuohy  Needle gauge: 17 G Needle length: 9 cm and 9 Needle insertion depth: 6 cm Catheter type: closed end flexible Catheter size: 19 Gauge Catheter at skin depth: 12 cm Test dose: negative  Assessment Events: blood not aspirated, injection not painful, no injection resistance, no paresthesia and negative IV test  Additional Notes Patient identified. Risks/Benefits/Options discussed with patient including but not limited to bleeding, infection, nerve damage, paralysis, failed block, incomplete pain control, headache, blood pressure changes, nausea, vomiting, reactions to medication both or allergic, itching and postpartum back pain. Confirmed with bedside nurse the patient's most recent platelet count. Confirmed with patient that they are not currently taking any anticoagulation, have any bleeding history or any family history of bleeding disorders. Patient expressed understanding and wished to proceed. All questions were answered. Sterile technique was used throughout the entire procedure. Please see nursing notes for vital signs. Test dose was given through epidural needle and negative prior to continuing to dose epidural or start infusion. Warning signs of high block given to the patient including shortness of breath, tingling/numbness in hands, complete motor block, or any  concerning symptoms with instructions to call for help. Patient was given instructions on fall risk and not to get out of bed. All questions and concerns addressed with instructions to call with any issues. 2 Attempt (S) . Patient tolerated procedure well.

## 2019-09-13 NOTE — H&P (Addendum)
Miranda Wheeler is a 29 y.o. female, G2P1001, IUP at 40.4 weeks, presenting for spontaneous early labor with SROM. RhD negative rhogam given second trimester. EFW 4.12lb via [place] on 12/7. Smoker. EIF left and right ventricular foci noted on [place]. Pt has h/o mood disorder and substance abuse, lack of social support, followed by [facility].   Pt endorse + Fm. Denies vaginal bleeding.   Patient Active Problem List   Diagnosis Date Noted  . Normal labor and delivery 09/13/2019   Medications Prior to Admission  Medication Sig Dispense Refill Last Dose  . neomycin-polymyxin-hydrocortisone (CORTISPORIN) 3.5-10000-1 OTIC suspension Place 4 drops into the left ear 4 (four) times daily. X 7 days 10 mL 0   . oxyCODONE-acetaminophen (PERCOCET/ROXICET) 5-325 MG tablet Take 1 tablet by mouth every 4 (four) hours as needed for severe pain. 12 tablet 0     No past medical history on file.   No current facility-administered medications on file prior to encounter.   Current Outpatient Medications on File Prior to Encounter  Medication Sig Dispense Refill  . neomycin-polymyxin-hydrocortisone (CORTISPORIN) 3.5-10000-1 OTIC suspension Place 4 drops into the left ear 4 (four) times daily. X 7 days 10 mL 0  . oxyCODONE-acetaminophen (PERCOCET/ROXICET) 5-325 MG tablet Take 1 tablet by mouth every 4 (four) hours as needed for severe pain. 12 tablet 0     No Known Allergies  History of present pregnancy: Pt Info/Preference:  Screening/Consents:  Labs:   EDD: Estimated Date of Delivery: 09/09/19  Establised: Patient's last menstrual period was 09/14/2018.  Anatomy Scan: Date: 04/24/2020 Placenta Location: anterior Genetic Screen: Panoroma:low risk female AFP:  First Tri: Quad:  Office: [facility]             First PNV: 15.1 wg Blood Type A/Negative/-- (07/09 0000)  Language: english Last PNV: 40.1 wg Rhogam    Flu Vaccine:  declined   Antibody Negative (07/09 0000)  TDaP vaccine utd   GTT: Early: normal Third  Trimester: 92  Feeding Plan: Breast/bottle BTL: no Rubella: Immune (07/09 0000)  Contraception: ??? VBAC: no RPR: Nonreactive (07/09 0000)   Circumcision: ???   HBsAg: Negative (07/09 0000)  Pediatrician:  ???   HIV: Non-reactive (07/09 0000)   Prenatal Classes: no Additional [place]: Growth see below  GBS: Negative/-- (12/22 0000)(For PCN allergy, check sensitivities)       Chlamydia: neg    MFM Referral/Consult:  GC: neg  Support Person: husband   PAP: 2020-normal  Pain Management: epidural Neonatologist Referral:  Hgb Electrophoresis:  AA  Birth Plan: none   Hgb NOB: 12.2    28W: 11.1  Growth 12/7  OB History    Gravida  2   Para  1   Term      Preterm      AB      Living        SAB      TAB      Ectopic      Multiple      Live Births             No past medical history on file. Past Surgical History:  Procedure Laterality Date  . THYROID SURGERY     Family History: Family history is unknown by patient. Social History:  reports that she has been smoking. She has been smoking about 0.50 packs per day. She has never used smokeless tobacco. She reports current alcohol use. She reports previous drug use.   Prenatal Transfer Tool  Maternal  Diabetes: No Genetic Screening: Normal Maternal Ultrasounds/Referrals: Normal Fetal Ultrasounds or other Referrals:  Referred to Materal Fetal Medicine  Maternal Substance Abuse:  No Significant Maternal Medications:  None smoker Significant Maternal Lab Results: Group B Strep negative  ROS:  Review of Systems  Constitutional: Negative.   HENT: Negative.   Eyes: Negative.   Respiratory: Negative.   Cardiovascular: Negative.   Gastrointestinal: Positive for abdominal pain.  Genitourinary: Negative.        Vaginal leakage   Musculoskeletal: Negative.   Skin: Negative.   Neurological: Negative.   Endo/Heme/Allergies: Negative.   Psychiatric/Behavioral: Negative.      Physical Exam: BP 123/85   Pulse 96   Temp  98.2 F (36.8 C)   Resp 18   LMP 09/14/2018   SpO2 100%   Physical Exam  Constitutional: She is oriented to person, place, and time and well-developed, well-nourished, and in no distress.  HENT:  Head: Normocephalic and atraumatic.  Eyes: Pupils are equal, round, and reactive to light. Conjunctivae are normal.  Cardiovascular: Normal rate and regular rhythm.  Pulmonary/Chest: Effort normal and breath sounds normal.  Abdominal: Soft. Bowel sounds are normal.  Genitourinary:    Cervix normal.     Genitourinary Comments: Uterus gravida equal to dates, soft non-tender , pelvis proven to 7.6lbs, pelvis adequate    Musculoskeletal:        General: Normal range of motion.     Cervical back: Normal range of motion and neck supple.  Neurological: She is alert and oriented to person, place, and time. Gait normal.  Skin: Skin is warm and dry.  Psychiatric: Affect normal.  Nursing note and vitals reviewed.    NST: FHR baseline 130 bpm, Variability: moderate, Accelerations:present, Decelerations:  Absent= Cat 1/Reactive UC:  Regular 3-4, lasting 60-120 seconds  SVE:  4/80/-2  , vertex verified by fetal sutures.  Leopold's: Position vertex, EFW 5.5lbs via leopold's.   Labs: No results found for this or any previous visit (from the past 24 hour(s)).  Imaging:  No results found.  MAU Course: Orders Placed This Encounter  Procedures  . SARS CORONAVIRUS 2 (TAT 6-24 HRS) Nasopharyngeal Nasopharyngeal Swab  . CBC  . RPR  . Cervical Exam  . Type and screen [place]  . Insert and maintain IV Line  . Admit to Inpatient (patient's expected length of stay will be greater than 2 midnights or inpatient only procedure)   Meds ordered this encounter  Medications  . lactated ringers infusion    Assessment/Plan: Miranda Wheeler is a 29 y.o. female, G2P1001, IUP at 40.4 weeks, presenting for spontaneous early labor with SROM. RhD negative rhogam given second trimester. EFW  4.12lb via [place] on 12/7. Smoker. EIF left and right ventricular foci noted on [place]. Pt has h/o mood disorder and substance abuse, lack of social support, followed by [facility]   Pt endorse + Fm. Denies vaginal bleeding.   FWB: Cat 1 Fetal Tracing.   Plan: Admit to [place] per consult with Dr Charlesetta Garibaldi Routine [facility] orders Pain med/epidural prn Anticipate labor progression   Noralyn Pick NP-C, CNM, MSN 09/13/2019, 4:29 PM

## 2019-09-13 NOTE — Consult Note (Signed)
Delivery Note:  SVD    09/13/2019  11:51 PM  I was called to the delivery room at the request of the patient's obstetrician (Dr. Dillard) for non reassuring fetal heart tones.  PRENATAL HX:  This is a 28 y/o G2P1 at 40 and 4/[redacted] weeks gestation who was admitted in labor with SROM.  She is GBS negative.  Pregnancy complicated by mood disorder and substance abuse (marijuana).  EIF left and right ventricular foci noted on prenatal ultrasound.  She is Rh negative and received rhogam.     DELIVERY:  Infant had poor tone at delivery, so cord clamping was not delayed.  HR > 100 but he remained apneic after standard warming, drying and stimulation so PPV initiated at around 1 minute of age.  After 30 seconds he began to breathe spontaneously.  O2 saturations rose to 90% by minutes of age in RA.  Tone improved by 7 minutes of age.  APGARs 2 and 8.  Exam within normal limits.  After 10 minutes, baby left with nurse to assist parents with skin-to-skin care.   _____________________ Electronically Signed By: Rayder Sullenger, MD Neonatologist  

## 2019-09-13 NOTE — MAU Note (Signed)
Miranda Wheeler is a 29 y.o. at [redacted]w[redacted]d here in MAU reporting:  +LOF. Clear with pink tinge.Patient also noted to be grossly ruptured. Fluid noted on wheelchair, through clothing, and trickling down legs onto floor. Endorses previous vaginal delivery. Denies complications this pregnancy.  Patient transported from [facility] ED by MD and EMT. Patient very verbal upon arrival.   Vitals:   09/13/19 1600 09/13/19 1622  BP: 123/85 107/63  Pulse: 96 91  Resp: 18 20  Temp: 98.2 F (36.8 C)   SpO2: 100% 100%     FHT:120 via efm

## 2019-09-13 NOTE — Anesthesia Preprocedure Evaluation (Signed)
Anesthesia Evaluation  Patient identified by MRN, date of birth, ID band Patient awake    Reviewed: Allergy & Precautions, NPO status , Patient's Chart, lab work & pertinent test results  Airway Mallampati: II  TM Distance: >3 FB Neck ROM: Full    Dental no notable dental hx. (+) Teeth Intact   Pulmonary Current Smoker,    Pulmonary exam normal breath sounds clear to auscultation       Cardiovascular negative cardio ROS Normal cardiovascular exam Rhythm:Regular Rate:Normal     Neuro/Psych negative neurological ROS     GI/Hepatic negative GI ROS, (+)     substance abuse  ,   Endo/Other  negative endocrine ROS  Renal/GU negative Renal ROS     Musculoskeletal   Abdominal   Peds  Hematology Hgb 11.7 PLt 261   Anesthesia Other Findings   Reproductive/Obstetrics (+) Pregnancy                             Anesthesia Physical Anesthesia Plan  ASA: II  Anesthesia Plan: Epidural   Post-op Pain Management:    Induction:   PONV Risk Score and Plan:   Airway Management Planned:   Additional Equipment:   Intra-op Plan:   Post-operative Plan:   Informed Consent: I have reviewed the patients History and Physical, chart, labs and discussed the procedure including the risks, benefits and alternatives for the proposed anesthesia with the patient or authorized representative who has indicated his/her understanding and acceptance.       Plan Discussed with:   Anesthesia Plan Comments: (40 4/7wk G2P1 w hx of substnace abuse and IUGR for LEA)        Anesthesia Quick Evaluation

## 2019-09-14 ENCOUNTER — Encounter (HOSPITAL_COMMUNITY): Payer: Self-pay | Admitting: Obstetrics and Gynecology

## 2019-09-14 DIAGNOSIS — F3181 Bipolar II disorder: Secondary | ICD-10-CM | POA: Diagnosis present

## 2019-09-14 DIAGNOSIS — F1910 Other psychoactive substance abuse, uncomplicated: Secondary | ICD-10-CM | POA: Diagnosis present

## 2019-09-14 LAB — CBC
HCT: 31.2 % — ABNORMAL LOW (ref 36.0–46.0)
Hemoglobin: 10.1 g/dL — ABNORMAL LOW (ref 12.0–15.0)
MCH: 31.1 pg (ref 26.0–34.0)
MCHC: 32.4 g/dL (ref 30.0–36.0)
MCV: 96 fL (ref 80.0–100.0)
Platelets: 216 10*3/uL (ref 150–400)
RBC: 3.25 MIL/uL — ABNORMAL LOW (ref 3.87–5.11)
RDW: 13.8 % (ref 11.5–15.5)
WBC: 21.2 10*3/uL — ABNORMAL HIGH (ref 4.0–10.5)
nRBC: 0 % (ref 0.0–0.2)

## 2019-09-14 LAB — RPR: RPR Ser Ql: NONREACTIVE

## 2019-09-14 MED ORDER — BENZOCAINE-MENTHOL 20-0.5 % EX AERO: 1.0000 "application " | INHALATION_SPRAY | CUTANEOUS | Status: DC | PRN

## 2019-09-14 MED ORDER — SIMETHICONE 80 MG PO CHEW: 80.0000 mg | CHEWABLE_TABLET | ORAL | Status: DC | PRN

## 2019-09-14 MED ORDER — TETANUS-DIPHTH-ACELL PERTUSSIS 5-2.5-18.5 LF-MCG/0.5 IM SUSP: 0.5000 mL | Freq: Once | INTRAMUSCULAR | Status: DC

## 2019-09-14 MED ORDER — SENNOSIDES-DOCUSATE SODIUM 8.6-50 MG PO TABS
2.0000 | ORAL_TABLET | ORAL | Status: DC
  Filled 2019-09-14: qty 2

## 2019-09-14 MED ORDER — DIBUCAINE (PERIANAL) 1 % EX OINT: 1.0000 "application " | TOPICAL_OINTMENT | CUTANEOUS | Status: DC | PRN

## 2019-09-14 MED ORDER — ONDANSETRON HCL 4 MG PO TABS: 4.0000 mg | ORAL_TABLET | ORAL | Status: DC | PRN

## 2019-09-14 MED ORDER — PRENATAL MULTIVITAMIN CH
1.0000 | ORAL_TABLET | Freq: Every day | ORAL | Status: DC
  Filled 2019-09-14: qty 1

## 2019-09-14 MED ORDER — COCONUT OIL OIL: 1.0000 "application " | TOPICAL_OIL | Status: DC | PRN

## 2019-09-14 MED ORDER — ACETAMINOPHEN 325 MG PO TABS
650.0000 mg | ORAL_TABLET | ORAL | Status: DC | PRN
  Administered 2019-09-15: 650 mg via ORAL
  Filled 2019-09-14: qty 2

## 2019-09-14 MED ORDER — IBUPROFEN 600 MG PO TABS
600.0000 mg | ORAL_TABLET | Freq: Four times a day (QID) | ORAL | Status: DC
  Administered 2019-09-14 – 2019-09-15 (×3): 600 mg via ORAL
  Filled 2019-09-14 (×4): qty 1

## 2019-09-14 MED ORDER — DIPHENHYDRAMINE HCL 25 MG PO CAPS: 25.0000 mg | ORAL_CAPSULE | Freq: Four times a day (QID) | ORAL | Status: DC | PRN

## 2019-09-14 MED ORDER — ONDANSETRON HCL 4 MG/2ML IJ SOLN: 4.0000 mg | INTRAMUSCULAR | Status: DC | PRN

## 2019-09-14 MED ORDER — WITCH HAZEL-GLYCERIN EX PADS: 1.0000 "application " | MEDICATED_PAD | CUTANEOUS | Status: DC | PRN

## 2019-09-14 MED ORDER — ZOLPIDEM TARTRATE 5 MG PO TABS: 5.0000 mg | ORAL_TABLET | Freq: Every evening | ORAL | Status: DC | PRN

## 2019-09-14 MED ORDER — RHO D IMMUNE GLOBULIN 1500 UNIT/2ML IJ SOSY
300.0000 ug | PREFILLED_SYRINGE | Freq: Once | INTRAMUSCULAR | Status: AC
  Administered 2019-09-14: 300 ug via INTRAVENOUS
  Filled 2019-09-14: qty 2

## 2019-09-14 NOTE — Progress Notes (Addendum)
Subjective: Postpartum Day # 1 : S/P NSVD due to spontaneous labor with SROM. Patient up ad lib, denies syncope or dizziness. Reports consuming regular diet without issues and denies N/V. Patient reports 0 bowel movement + passing flatus.  Denies issues with urination and reports bleeding is "lighter."  Patient is bottle feeding and reports going well.  Desires undecided for postpartum contraception.  Pain is being appropriately managed with use of po meds. H.O substance abuse, + for [place] on admission. H/O bipolar, mood stable, denies being on meds or being followed for treatment.   Bi-lat superficial labial laceration Feeding:  bottle Contraceptive plan:  undecided BB: Circ undecided   Objective: Vital signs in last 24 hours: Patient Vitals for the past 24 hrs:  BP Temp Temp src Pulse Resp SpO2 Height Weight  09/14/19 0740 114/63 98.8 F (37.1 C) Oral -- 18 98 % -- --  09/14/19 0300 113/63 98.7 F (37.1 C) Axillary 64 18 97 % -- --  09/14/19 0155 108/67 98.6 F (37 C) Axillary 69 18 95 % -- --  09/14/19 0102 (!) 100/50 -- -- 80 -- -- -- --  09/14/19 0031 133/70 -- -- 69 -- -- -- --  09/14/19 0016 (!) 131/105 -- -- 77 -- -- -- --  09/14/19 0010 (!) 120/98 -- -- 72 -- -- -- --  09/14/19 0006 (!) 118/92 -- -- 83 -- -- -- --  09/13/19 2352 (!) 121/47 -- -- 83 -- -- -- --  09/13/19 2245 (!) 103/56 99.8 F (37.7 C) Oral 64 20 -- -- --  09/13/19 2200 104/61 -- -- 74 18 100 % -- --  09/13/19 2130 (!) 102/58 -- -- 78 -- 97 % -- --  09/13/19 2115 -- 98.4 F (36.9 C) Oral -- 18 -- -- --  09/13/19 2100 114/62 -- -- 66 -- -- -- --  09/13/19 2030 116/60 -- -- 67 18 -- -- --  09/13/19 2000 107/60 -- -- 85 18 -- -- --  09/13/19 1940 (!) 96/56 -- -- 89 -- -- -- --  09/13/19 1931 -- 97.9 F (36.6 C) Oral -- 16 -- -- --  09/13/19 1903 (!) 111/51 -- -- 76 -- -- -- --  09/13/19 1844 111/62 -- -- 73 18 -- -- --  09/13/19 1805 120/64 -- -- 76 18 -- -- --  09/13/19 1801 (!) 129/41 -- -- 76 18 --  -- --  09/13/19 1755 130/61 -- -- 75 18 -- -- --  09/13/19 1750 126/74 -- -- 80 18 -- -- --  09/13/19 1745 125/70 -- -- 78 18 -- -- --  09/13/19 1740 (!) 113/55 -- -- 79 18 -- -- --  09/13/19 1735 125/69 -- -- 88 18 -- -- --  09/13/19 1732 125/62 -- -- 84 18 -- -- --  09/13/19 1730 100/83 -- -- 67 18 -- -- --  09/13/19 1729 129/73 -- -- 99 18 -- -- --  09/13/19 1656 -- -- -- -- -- -- 5\' 4"  (1.626 m) 74.4 kg  09/13/19 1622 107/63 -- -- 91 20 100 % -- --  09/13/19 1600 123/85 98.2 F (36.8 C) -- 96 18 100 % -- --     Physical Exam:  General: alert, cooperative, appears stated age and no distress Mood/Affect: Happy Lungs: clear to auscultation, no wheezes, rales or rhonchi, symmetric air entry.  Heart: normal rate, regular rhythm, normal S1, S2, no murmurs, rubs, clicks or gallops. Breast: breasts appear normal, no suspicious masses, no skin or  nipple changes or axillary nodes. Abdomen:  + bowel sounds, soft, non-tender GU: perineum intact, healing well. No signs of external hematomas.  Uterine Fundus: firm Lochia: appropriate Skin: Warm, Dry. DVT Evaluation: No evidence of DVT seen on physical exam. Negative Homan's sign. No cords or calf tenderness. No significant calf/ankle edema.  CBC Latest Ref Rng & Units 09/14/2019 09/13/2019  WBC 4.0 - 10.5 K/uL 21.2(H) 15.4(H)  Hemoglobin 12.0 - 15.0 g/dL 10.1(L) 11.7(L)  Hematocrit 36.0 - 46.0 % 31.2(L) 37.4  Platelets 150 - 400 K/uL 216 261    Results for orders placed or performed during the hospital encounter of 09/13/19 (from the past 24 hour(s))  CBC     Status: Abnormal   Collection Time: 09/13/19  4:22 PM  Result Value Ref Range   WBC 15.4 (H) 4.0 - 10.5 K/uL   RBC 3.82 (L) 3.87 - 5.11 MIL/uL   Hemoglobin 11.7 (L) 12.0 - 15.0 g/dL   HCT 20.2 54.2 - 70.6 %   MCV 97.9 80.0 - 100.0 fL   MCH 30.6 26.0 - 34.0 pg   MCHC 31.3 30.0 - 36.0 g/dL   RDW 23.7 62.8 - 31.5 %   Platelets 261 150 - 400 K/uL   nRBC 0.0 0.0 - 0.2 %  Type  and screen [facility]     Status: None (Preliminary result)   Collection Time: 09/13/19  4:22 PM  Result Value Ref Range   ABO/RH(D) A NEG    Antibody Screen POS    Sample Expiration 09/16/2019,2359    Antibody Identification PASSIVELY ACQUIRED ANTI-D    Unit Number [id number]    Blood Component Type RED CELLS,LR    Unit division 00    Status of Unit ALLOCATED    Transfusion Status OK TO TRANSFUSE    Crossmatch Result COMPATIBLE    Unit Number [id number]    Blood Component Type RED CELLS,LR    Unit division 00    Status of Unit ALLOCATED    Transfusion Status OK TO TRANSFUSE    Crossmatch Result COMPATIBLE   SARS CORONAVIRUS 2 (TAT 6-24 HRS) Nasopharyngeal Nasopharyngeal Swab     Status: None   Collection Time: 09/13/19  4:27 PM   Specimen: Nasopharyngeal Swab  Result Value Ref Range   SARS Coronavirus 2 NEGATIVE NEGATIVE  Urine rapid drug screen (hosp performed)     Status: Abnormal   Collection Time: 09/13/19  7:27 PM  Result Value Ref Range   Opiates NONE DETECTED NONE DETECTED   Cocaine NONE DETECTED NONE DETECTED   Benzodiazepines NONE DETECTED NONE DETECTED   Amphetamines NONE DETECTED NONE DETECTED   Tetrahydrocannabinol POSITIVE (A) NONE DETECTED   Barbiturates NONE DETECTED NONE DETECTED  CBC     Status: Abnormal   Collection Time: 09/14/19  5:48 AM  Result Value Ref Range   WBC 21.2 (H) 4.0 - 10.5 K/uL   RBC 3.25 (L) 3.87 - 5.11 MIL/uL   Hemoglobin 10.1 (L) 12.0 - 15.0 g/dL   HCT 09.3 (L) 81.8 - 29.9 %   MCV 96.0 80.0 - 100.0 fL   MCH 31.1 26.0 - 34.0 pg   MCHC 32.4 30.0 - 36.0 g/dL   RDW 37.1 69.6 - 78.9 %   Platelets 216 150 - 400 K/uL   nRBC 0.0 0.0 - 0.2 %  Rh IG workup (includes ABO/Rh)     Status: None (Preliminary result)   Collection Time: 09/14/19  5:48 AM  Result Value Ref Range   Gestational  Age(Wks) 40.4    ABO/RH(D) A NEG    Fetal Screen NEG    Unit Number [id number]    Blood Component Type RHIG    Unit division  00    Status of Unit ISSUED    Transfusion Status      OK TO TRANSFUSE Performed at [facility] [address], [place], [place] [address]      CBG (last 3)  No results for input(s): GLUCAP in the last 72 hours.   I/O last 3 completed shifts: In: 181.2 [I.V.:181.2] Out: 1750 [Urine:1450; Blood:300]   Assessment Postpartum Day # 1 : S/P NSVD due to spontaneous labor with SROM. Pt stable. -1 involution. Bottle feeding. Hemodynamically stable with drop of 11.7-10.1, EBl was 444mls. Bipolar: no meds, not tx, stable mood, denies si/hi .   Plan: Continue other mgmt as ordered MariJ use: encouraged cessation.  Bipolar: Encouraged talking, relaxation, Education officer, museum, help with resources.  VTE prophylactics: Early ambulated as tolerates.  Pain control: Motrin/Tylenol PRN Education given regarding options for contraception, including barrier methods, injectable contraception, IUD placement, oral contraceptives.  Plan for discharge tomorrow and Social Work consult   Dr. Charlesetta Garibaldi to be updated on patient status  [facility] NP-C, CNM 09/14/2019, 10:48 AM

## 2019-09-14 NOTE — Anesthesia Postprocedure Evaluation (Signed)
Anesthesia Post Note  Patient: Careers information officer  Procedure(s) Performed: AN AD HOC LABOR EPIDURAL     Patient location during evaluation: Mother Baby Anesthesia Type: Epidural Level of consciousness: awake Pain management: satisfactory to patient Vital Signs Assessment: post-procedure vital signs reviewed and stable Respiratory status: spontaneous breathing Cardiovascular status: stable Anesthetic complications: no    Last Vitals:  Vitals:   09/14/19 0740 09/14/19 1100  BP: 114/63 (!) 115/52  Pulse: 69 63  Resp: 18 18  Temp: 37.1 C 36.7 C  SpO2: 98% 100%    Last Pain:  Vitals:   09/14/19 1101  TempSrc:   PainSc: 0-No pain   Pain Goal: Patients Stated Pain Goal: 3 (09/13/19 1657)                 Cephus Shelling
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
## 2019-09-14 NOTE — Lactation Note (Signed)
This note was copied from a baby's chart. Lactation Consultation Note  Patient Name: Boy Miranda Wheeler [id number] Date: 09/14/2019 Reason for consult: Follow-up assessment;Term;Primapara;1st time breastfeeding  P2 mother whose infant is now 30 hours old.  Mother breast fed her first child (now 29 years old) for 4 months.  Her first child lives with his father.  Admission UDS was positive for [facility] per MD notes.  Baby was asleep in mother's arms when I arrived.  She stated that he has latched on well.  She can easily express colostrum.  Container provided and milk storage times reviewed.  Finger feeding demonstrated.  Encouraged feeding 8-12 times/24 hours or sooner if baby shows feeding cues.  Reviewed feeding cues.  Colostrum container provided and milk storage times discussed.  Finger feeding demonstrated.  Encouraged mother to call her RN/LC for latch assistance as needed.  Mother does not have a DEBP for home use.  She is a [facility] participant in [facility]; suggested she contact the [facility] office tomorrow about obtaining a DEBP if desired.  Mother has a manual pump.  Support person present.     Maternal Data Formula Feeding for Exclusion: No Has patient been taught Hand Expression?: Yes Does the patient have breastfeeding experience prior to this delivery?: No  Feeding    LATCH Score                   Interventions    Lactation Tools Discussed/Used WIC Program: Yes   Consult Status Consult Status: Follow-up Date: 09/15/19 Follow-up type: In-patient    Ronny Korff R Fischer Halley 09/14/2019, 2:42 PM

## 2019-09-14 NOTE — Lactation Note (Signed)
This note was copied from a baby's chart. Lactation Consultation Note  Patient Name: Miranda Wheeler Today's Date: 09/14/2019 Reason for consult: Initial assessment;Term;Other (Comment)(Infant DAT+) P2, 4 hour female infant. Mom with hx of smoking, mood disorder and marijuana  usage.  Infant had one stool since birth. LC entered the room infant was  asleep in  the basinet. Per mom, she finished breastfeeding infant prior to [facility] entering the room for 10 minutes, LC did not see a latch at this time. Per mom, she feels infant is latching well at breast,  per mom, she breastfeed her 1st child for 4 months. Per mom, she is active on the [facility] program in [facility] but she doesn't have a breast pump at home.  LC gave mom hand pump prn, mom shown how to use hand pump & how to disassemble, clean, & reassemble parts. Per mom, she knows how to hand express. Mom understands to breastfeed infant according to hunger cues, 8 to 12 times within 24 hours, on demand and not to exceed 3 hours without breastfeeding infant. Mom will continue to do STS as much as possible. Mom knows to call RN or LC if she has any questions, concerns or need assistance with latching infant at breast. Reviewed Baby & Me book's Breastfeeding Basics.  Mom made aware of O/P services, breastfeeding support groups, community resources, and our phone # for post-discharge questions.   Maternal Data Formula Feeding for Exclusion: No Has patient been taught Hand Expression?: Yes Does the patient have breastfeeding experience prior to this delivery?: Yes  Feeding    LATCH Score                   Interventions Interventions: Breast feeding basics reviewed;Skin to skin;Hand pump  Lactation Tools Discussed/Used WIC Program: Yes Pump Review: Setup, frequency, and cleaning;Milk Storage Initiated by:: Danelle Earthly, IBCLC Date initiated:: 09/14/19   Consult Status Consult Status: Follow-up Date:  09/14/19 Follow-up type: In-patient    Danelle Earthly 09/14/2019, 3:41 AM

## 2019-09-15 LAB — RH IG WORKUP (INCLUDES ABO/RH)
ABO/RH(D): A NEG
Fetal Screen: NEGATIVE
Gestational Age(Wks): 40.4
Unit division: 0

## 2019-09-15 MED ORDER — ACETAMINOPHEN 325 MG PO TABS: 650.0000 mg | ORAL_TABLET | ORAL | Status: AC | PRN

## 2019-09-15 MED ORDER — IBUPROFEN 600 MG PO TABS: 600.0000 mg | ORAL_TABLET | Freq: Four times a day (QID) | ORAL | 0 refills | Status: AC

## 2019-09-15 NOTE — Progress Notes (Signed)
AVS printed and discharge instructions given to patient. Patient aware of follow up appointment and to pick up prescriptions. patient verbalized understanding.

## 2019-09-15 NOTE — Lactation Note (Signed)
This note was copied from a baby's chart. Lactation Consultation Note  Patient Name: Miranda Wheeler [id number] Date: 09/15/2019 Reason for consult: Follow-up assessment Baby is 38 hours old/5% weight loss.  Baby has been cluster feeding and most feedings are 45-60 minutes.  Instructed to do breast massage and compression during feeding to increase flow and intake.  Instructed to continue feeding with cues and call for assist/concerns.  Maternal Data    Feeding Feeding Type: Breast Fed  LATCH Score                   Interventions    Lactation Tools Discussed/Used     Consult Status Consult Status: Follow-up Date: 09/16/19 Follow-up type: In-patient    Huston Foley 09/15/2019, 1:36 PM

## 2019-09-15 NOTE — Discharge Summary (Signed)
OB Discharge Summary     Patient Name: Miranda Wheeler DOB: [birth year] MRN: [id number]  Date of admission: 09/13/2019 Delivering MD: Dale [place]   Date of discharge: 09/15/2019  Admitting diagnosis: Normal labor and delivery [O80] Intrauterine pregnancy: [redacted]w[redacted]d     Secondary diagnosis:  Active Problems:   Normal labor and delivery   Retained placenta   Substance abuse (HCC)   Bipolar 2 disorder (HCC)   SVD (1/23)  Additional problems: none     Discharge diagnosis: Term Pregnancy Delivered                                                                                                Post partum procedures:IV antibiotics x1 dose for manual removal of placenta  Augmentation: none  Complications: Retained placenta requiring manual removal  Hospital course:  Onset of Labor With Vaginal Delivery     29 y.o. yo G2P1001 at [redacted]w[redacted]d was admitted in Active Labor on 09/13/2019. Patient had an uncomplicated labor course as follows:  Membrane Rupture Time/Date:  ,09/13/2019   Intrapartum Procedures: Episiotomy: None [1]                                         Lacerations:  Labial [10]  Patient had a delivery of a Viable infant. 09/13/2019  Information for the patient's newborn:  [place], [place] [[id number]]       Pateint had an uncomplicated postpartum course.  She is ambulating, tolerating a regular diet, passing flatus, and urinating well. Patient is discharged home in stable condition on 09/15/19.   Physical exam  Vitals:   09/14/19 1100 09/14/19 1336 09/14/19 1959 09/15/19 0556  BP: (!) 115/52 103/65 121/72 (!) 95/56  Pulse: 63 (!) 58 66 (!) 49  Resp: 18 19 18 16   Temp: 98 F (36.7 C) 98 F (36.7 C) 98.2 F (36.8 C) 98.5 F (36.9 C)  TempSrc:  Oral Oral Oral  SpO2: 100% 96% 98% 98%  Weight:      Height:       General: alert, cooperative and no distress Lochia: appropriate Uterine Fundus: firm Perineum: healing, non-edematous DVT Evaluation: No evidence of DVT  seen on physical exam. No cords or calf tenderness. No significant calf/ankle edema. Labs: Lab Results  Component Value Date   WBC 21.2 (H) 09/14/2019   HGB 10.1 (L) 09/14/2019   HCT 31.2 (L) 09/14/2019   MCV 96.0 09/14/2019   PLT 216 09/14/2019   No flowsheet data found.  Discharge instruction: per After Visit Summary and "Baby and Me Booklet".  After visit meds:  Allergies as of 09/15/2019   No Known Allergies     Medication List    STOP taking these medications   neomycin-polymyxin-hydrocortisone 3.5-10000-1 OTIC suspension Commonly known as: CORTISPORIN   oxyCODONE-acetaminophen 5-325 MG tablet Commonly known as: PERCOCET/ROXICET     TAKE these medications   acetaminophen 325 MG tablet Commonly known as: Tylenol Take 2 tablets (650 mg total) by mouth every 4 (four) hours as needed (  for pain scale < 4).   ibuprofen 600 MG tablet Commonly known as: ADVIL Take 1 tablet (600 mg total) by mouth every 6 (six) hours.       Diet: routine diet  Activity: Advance as tolerated. Pelvic rest for 6 weeks.   Outpatient follow up:Two week appt for mood evaluation and routine postpartum appointment at 6 weeks.  Follow up Appt:No future appointments. Follow up Visit:No follow-ups on file.  Postpartum contraception: Abstinence  Newborn Data: Live born female  Birth Weight: 7 lb 15.7 oz (3620 g) APGAR: 2, 8  Newborn Delivery   Birth date/time: 09/13/2019 23:28:00 Delivery type: Vaginal, Spontaneous      Baby Feeding: Breast Disposition:Social work to determine preparedness/option for newborn's discharge per CSW note on 09/14/19.    09/15/2019 Arrie Eastern, CNM

## 2019-09-15 NOTE — Progress Notes (Signed)
CSW received a telephone call CPS supervisor, Fred King. Per CPS there are no barriers to infant discharging to M[facility]. CPS reports that CPS will continue to provide resources and supports to family after discharge.   Dwane Andres Boyd-Gilyard, MSW, LCSW Clinical Social Work [phone] 

## 2019-09-16 ENCOUNTER — Inpatient Hospital Stay (HOSPITAL_COMMUNITY): Payer: Medicaid Other

## 2019-09-16 ENCOUNTER — Inpatient Hospital Stay (HOSPITAL_COMMUNITY)
Admission: AD | Admit: 2019-09-16 | Payer: Medicaid Other | Source: Home / Self Care | Admitting: Obstetrics and Gynecology

## 2019-09-16 LAB — BPAM RBC
Blood Product Expiration Date: 202102172359
Blood Product Expiration Date: 202102172359
Unit Type and Rh: 600
Unit Type and Rh: 600

## 2019-09-16 LAB — TYPE AND SCREEN
ABO/RH(D): A NEG
Antibody Screen: POSITIVE
Unit division: 0
Unit division: 0

## 2019-09-16 LAB — SURGICAL PATHOLOGY

## 2020-06-14 IMAGING — CT CT MAXILLOFACIAL W/O CM
3 series · 16 of 47 positions shown, 19 images · non-contrast
Comparison: None.

CLINICAL DATA: Recent fall with lip laceration and forehead
laceration, initial encounter

EXAM:
CT MAXILLOFACIAL WITHOUT CONTRAST
TECHNIQUE: Multidetector CT imaging of the maxillofacial structures was
performed. Multiplanar CT image reconstructions were also generated.

[Series 3: max soft · axial · 0.42mm/px · z∈[-142,+2]mm · 10 of 84 slices shown, 13 images]
[im 6/84  brain]
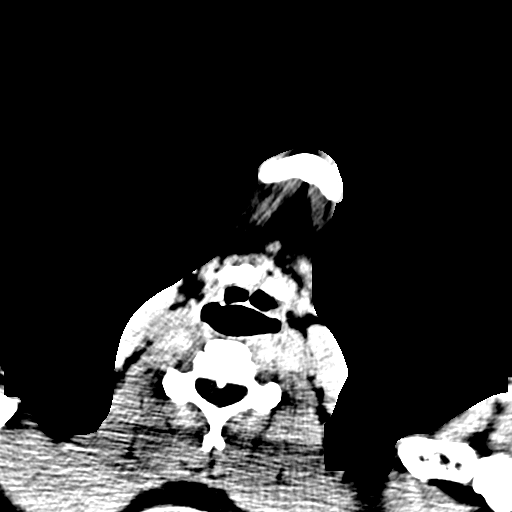
[im 6/84  bone]
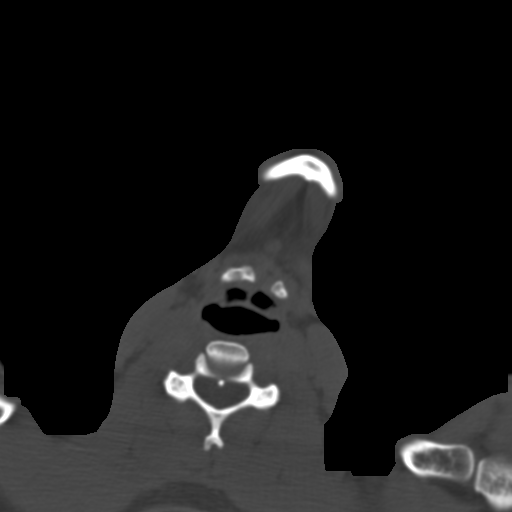
[im 15/84  bone]
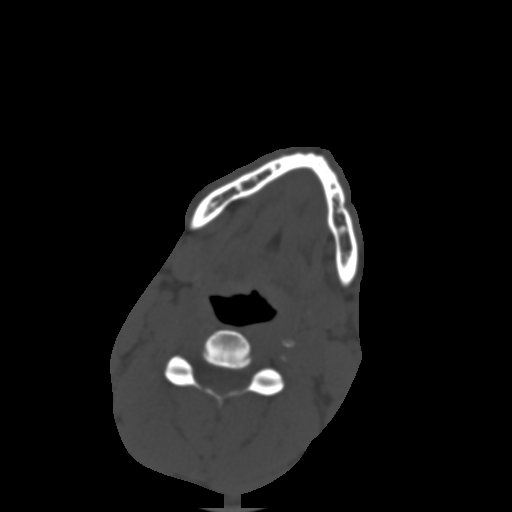
[im 23/84  bone]
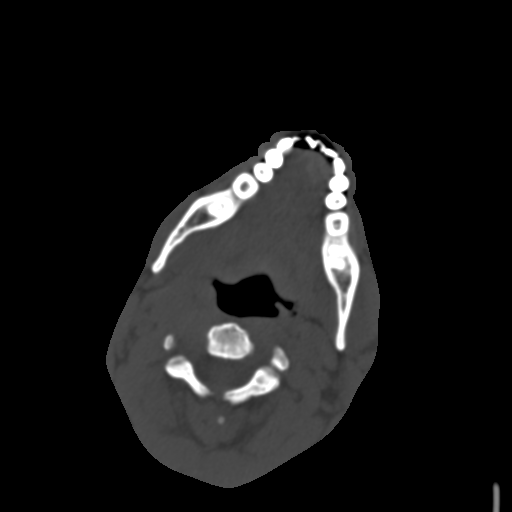
[im 29/84  bone]
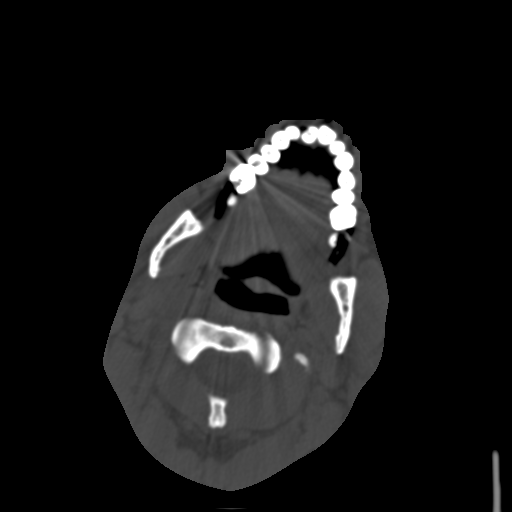
[im 38/84  brain]
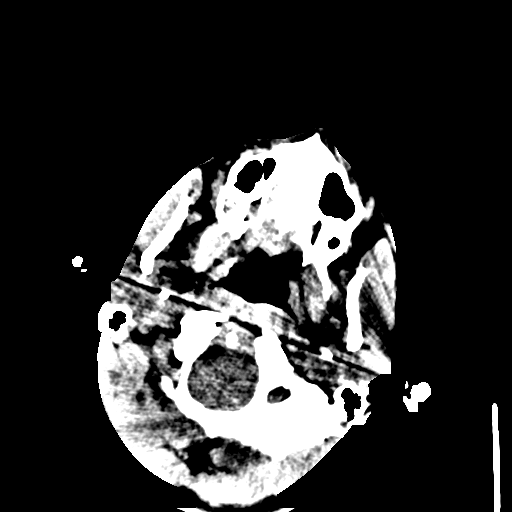
[im 38/84  bone]
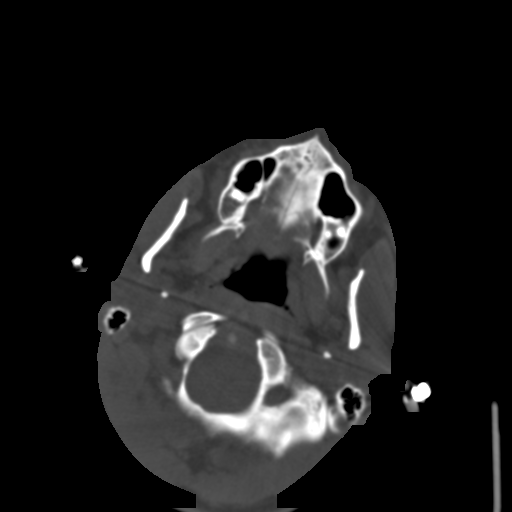
[im 46/84  bone]
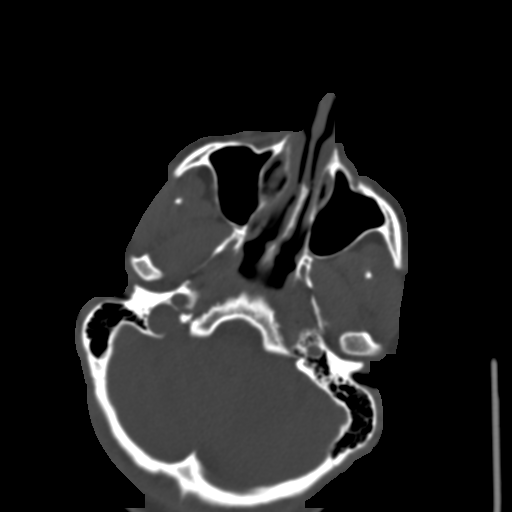
[im 55/84  bone]
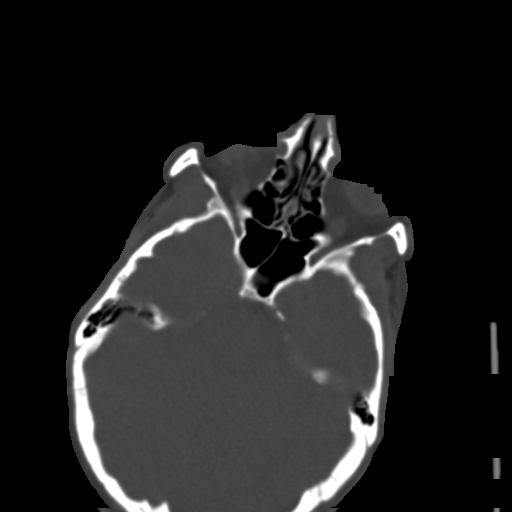
[im 63/84  bone]
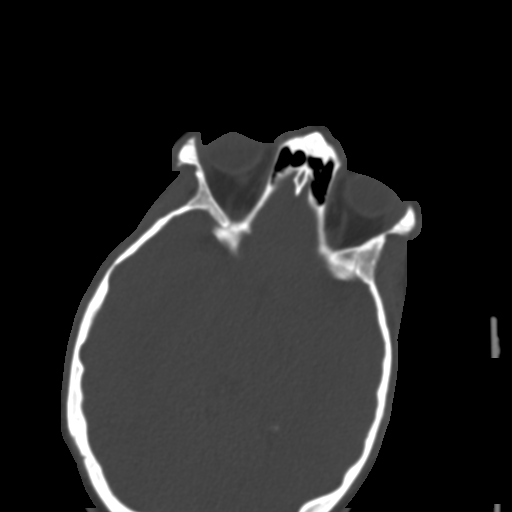
[im 69/84  brain]
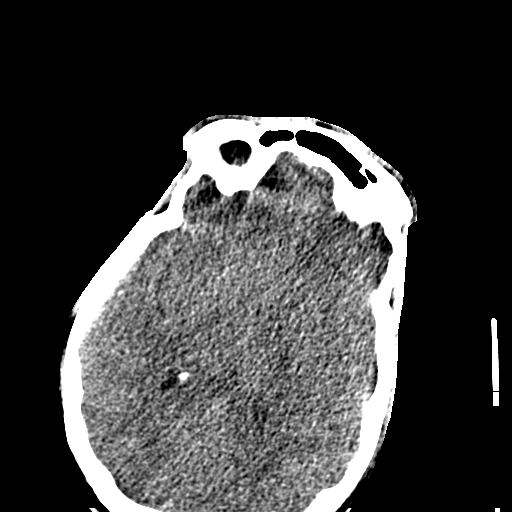
[im 69/84  bone]
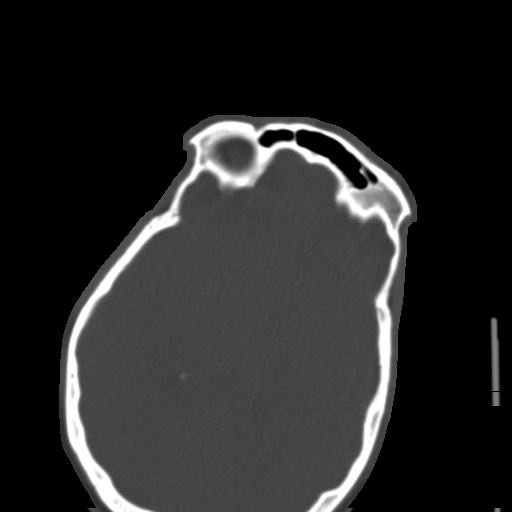
[im 78/84  bone]
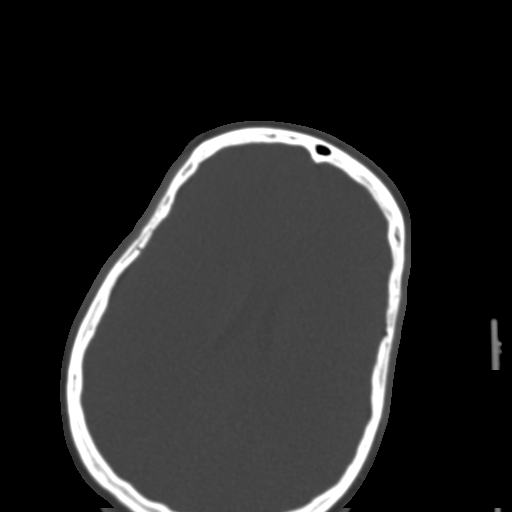

[Series 7: coronal soft · coronal · 0.38mm/px · 3 of 79 slices shown]
[im 27/79  bone]
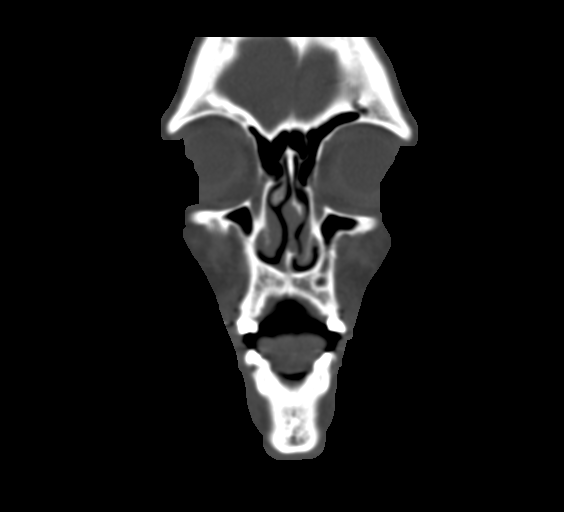
[im 35/79  bone]
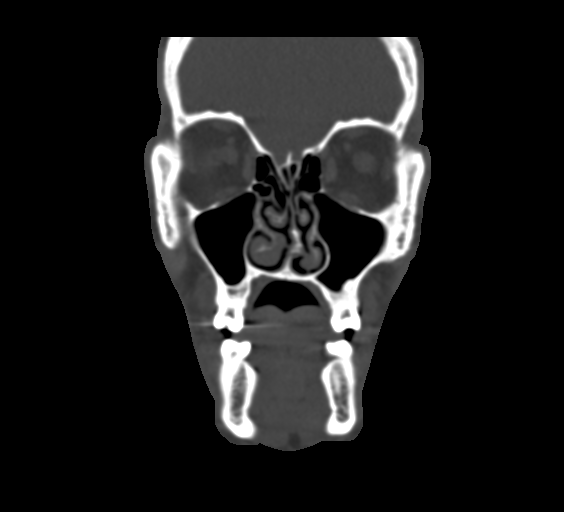
[im 44/79  bone]
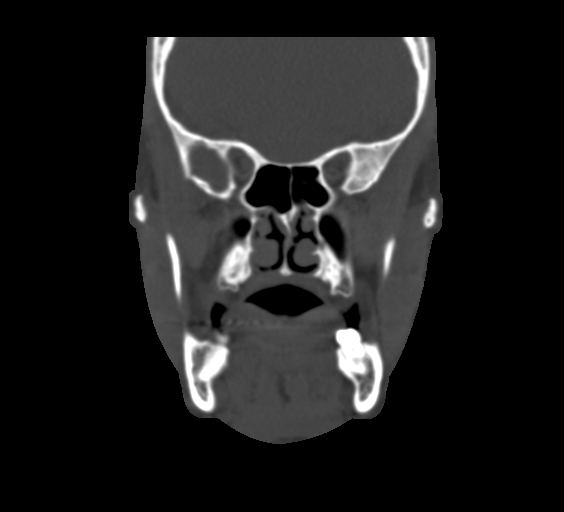

[Series 8: sagittal soft · sagittal · 0.38mm/px · 3 of 82 slices shown]
[im 28/82  bone]
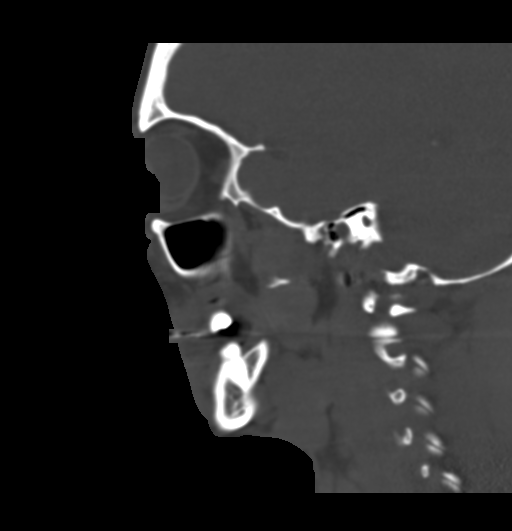
[im 41/82  bone]
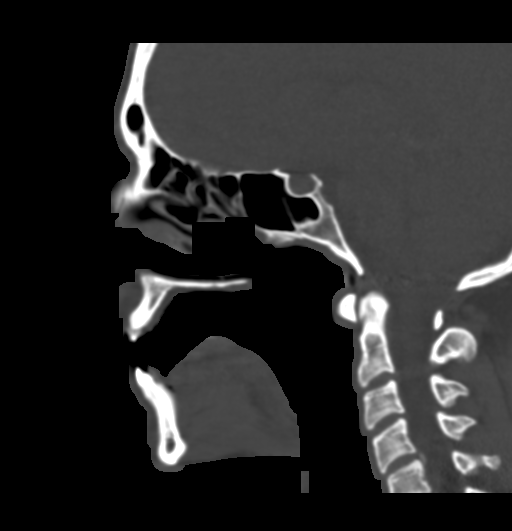
[im 55/82  bone]
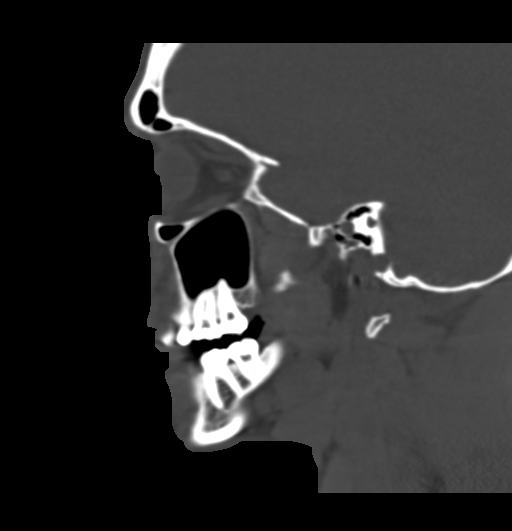

[16 of 47 positions shown; findings below may reference images not displayed]

FINDINGS: Osseous: No acute fracture is identified. A defect is noted within
the right maxillary central incisor on the right laterally likely
related to a acutely chipped tooth. No other acute dental
abnormality is noted.

Orbits: The orbits and their contents are within normal limits.

Sinuses: Paranasal sinuses are unremarkable.

Soft tissues: Laceration is noted through the midportion of the
upper lip. No other focal soft tissue abnormality is noted.

Limited intracranial: Within normal limits.
IMPRESSION: Upper lip laceration in the midline consistent with the given
clinical history.

There is a defect noted in the lateral aspect of the right maxillary
central incisor. This may be related to a chipped tooth.
